# Patient Record
Sex: Female | Born: 2000
Health system: Southern US, Community
[De-identification: ages and names within clinical notes are randomized; demographics above are authoritative.]

## PROBLEM LIST (undated history)

## (undated) DIAGNOSIS — M6289 Other specified disorders of muscle: Secondary | ICD-10-CM

## (undated) DIAGNOSIS — R29898 Other symptoms and signs involving the musculoskeletal system: Secondary | ICD-10-CM

## (undated) DIAGNOSIS — Z931 Gastrostomy status: Secondary | ICD-10-CM

## (undated) DIAGNOSIS — I890 Lymphedema, not elsewhere classified: Secondary | ICD-10-CM

## (undated) DIAGNOSIS — M419 Scoliosis, unspecified: Secondary | ICD-10-CM

## (undated) DIAGNOSIS — Z930 Tracheostomy status: Secondary | ICD-10-CM

## (undated) HISTORY — PX: MUSCLE BIOPSY: SHX716

## (undated) HISTORY — PX: TYMPANOSTOMY TUBE PLACEMENT: SHX32

## (undated) HISTORY — PX: BACK SURGERY: SHX140

## (undated) HISTORY — PX: GASTROSTOMY TUBE PLACEMENT: SHX655

## (undated) HISTORY — PX: TRACHEOSTOMY: SUR1362

---
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
## 2020-01-03 DIAGNOSIS — R279 Unspecified lack of coordination: Secondary | ICD-10-CM | POA: Diagnosis not present

## 2020-01-03 DIAGNOSIS — Q688 Other specified congenital musculoskeletal deformities: Secondary | ICD-10-CM | POA: Diagnosis not present

## 2020-01-04 DIAGNOSIS — Q688 Other specified congenital musculoskeletal deformities: Secondary | ICD-10-CM | POA: Diagnosis not present

## 2020-01-05 DIAGNOSIS — Q688 Other specified congenital musculoskeletal deformities: Secondary | ICD-10-CM | POA: Diagnosis not present

## 2020-01-06 DIAGNOSIS — Q688 Other specified congenital musculoskeletal deformities: Secondary | ICD-10-CM | POA: Diagnosis not present

## 2020-01-07 DIAGNOSIS — Q688 Other specified congenital musculoskeletal deformities: Secondary | ICD-10-CM | POA: Diagnosis not present

## 2020-01-08 DIAGNOSIS — Q688 Other specified congenital musculoskeletal deformities: Secondary | ICD-10-CM | POA: Diagnosis not present

## 2020-01-10 DIAGNOSIS — R279 Unspecified lack of coordination: Secondary | ICD-10-CM | POA: Diagnosis not present

## 2020-01-10 DIAGNOSIS — Q688 Other specified congenital musculoskeletal deformities: Secondary | ICD-10-CM | POA: Diagnosis not present

## 2020-01-11 DIAGNOSIS — Q688 Other specified congenital musculoskeletal deformities: Secondary | ICD-10-CM | POA: Diagnosis not present

## 2020-01-12 ENCOUNTER — Other Ambulatory Visit (HOSPITAL_COMMUNITY): Payer: Self-pay | Admitting: Pediatric Pulmonology

## 2020-01-12 DIAGNOSIS — Q688 Other specified congenital musculoskeletal deformities: Secondary | ICD-10-CM | POA: Diagnosis not present

## 2020-01-13 DIAGNOSIS — Z79899 Other long term (current) drug therapy: Secondary | ICD-10-CM | POA: Diagnosis not present

## 2020-01-13 DIAGNOSIS — J984 Other disorders of lung: Secondary | ICD-10-CM | POA: Diagnosis not present

## 2020-01-13 DIAGNOSIS — F419 Anxiety disorder, unspecified: Secondary | ICD-10-CM | POA: Diagnosis not present

## 2020-01-13 DIAGNOSIS — M4145 Neuromuscular scoliosis, thoracolumbar region: Secondary | ICD-10-CM | POA: Diagnosis not present

## 2020-01-13 DIAGNOSIS — G7129 Other congenital myopathy: Secondary | ICD-10-CM | POA: Diagnosis not present

## 2020-01-13 DIAGNOSIS — J4540 Moderate persistent asthma, uncomplicated: Secondary | ICD-10-CM | POA: Diagnosis not present

## 2020-01-13 DIAGNOSIS — Q688 Other specified congenital musculoskeletal deformities: Secondary | ICD-10-CM | POA: Diagnosis not present

## 2020-01-13 DIAGNOSIS — M419 Scoliosis, unspecified: Secondary | ICD-10-CM | POA: Diagnosis not present

## 2020-01-13 DIAGNOSIS — Z9911 Dependence on respirator [ventilator] status: Secondary | ICD-10-CM | POA: Diagnosis not present

## 2020-01-13 DIAGNOSIS — R0689 Other abnormalities of breathing: Secondary | ICD-10-CM | POA: Diagnosis not present

## 2020-01-13 DIAGNOSIS — Z930 Tracheostomy status: Secondary | ICD-10-CM | POA: Diagnosis not present

## 2020-01-14 DIAGNOSIS — Q688 Other specified congenital musculoskeletal deformities: Secondary | ICD-10-CM | POA: Diagnosis not present

## 2020-01-16 DIAGNOSIS — Q688 Other specified congenital musculoskeletal deformities: Secondary | ICD-10-CM | POA: Diagnosis not present

## 2020-01-17 DIAGNOSIS — R279 Unspecified lack of coordination: Secondary | ICD-10-CM | POA: Diagnosis not present

## 2020-01-17 DIAGNOSIS — Q688 Other specified congenital musculoskeletal deformities: Secondary | ICD-10-CM | POA: Diagnosis not present

## 2020-01-18 DIAGNOSIS — Q688 Other specified congenital musculoskeletal deformities: Secondary | ICD-10-CM | POA: Diagnosis not present

## 2020-01-18 DIAGNOSIS — Z931 Gastrostomy status: Secondary | ICD-10-CM | POA: Diagnosis not present

## 2020-01-18 DIAGNOSIS — J9611 Chronic respiratory failure with hypoxia: Secondary | ICD-10-CM | POA: Diagnosis not present

## 2020-01-18 DIAGNOSIS — Z930 Tracheostomy status: Secondary | ICD-10-CM | POA: Diagnosis not present

## 2020-01-19 DIAGNOSIS — Q688 Other specified congenital musculoskeletal deformities: Secondary | ICD-10-CM | POA: Diagnosis not present

## 2020-01-20 DIAGNOSIS — Q688 Other specified congenital musculoskeletal deformities: Secondary | ICD-10-CM | POA: Diagnosis not present

## 2020-01-21 DIAGNOSIS — Z9911 Dependence on respirator [ventilator] status: Secondary | ICD-10-CM | POA: Diagnosis not present

## 2020-01-21 DIAGNOSIS — Q799 Congenital malformation of musculoskeletal system, unspecified: Secondary | ICD-10-CM | POA: Diagnosis not present

## 2020-01-21 DIAGNOSIS — M4140 Neuromuscular scoliosis, site unspecified: Secondary | ICD-10-CM | POA: Diagnosis not present

## 2020-01-21 DIAGNOSIS — J984 Other disorders of lung: Secondary | ICD-10-CM | POA: Diagnosis not present

## 2020-01-21 DIAGNOSIS — Q688 Other specified congenital musculoskeletal deformities: Secondary | ICD-10-CM | POA: Diagnosis not present

## 2020-01-22 DIAGNOSIS — Z931 Gastrostomy status: Secondary | ICD-10-CM | POA: Diagnosis not present

## 2020-01-22 DIAGNOSIS — J9611 Chronic respiratory failure with hypoxia: Secondary | ICD-10-CM | POA: Diagnosis not present

## 2020-01-22 DIAGNOSIS — Z930 Tracheostomy status: Secondary | ICD-10-CM | POA: Diagnosis not present

## 2020-01-23 DIAGNOSIS — Q688 Other specified congenital musculoskeletal deformities: Secondary | ICD-10-CM | POA: Diagnosis not present

## 2020-01-24 DIAGNOSIS — R279 Unspecified lack of coordination: Secondary | ICD-10-CM | POA: Diagnosis not present

## 2020-01-24 DIAGNOSIS — Q688 Other specified congenital musculoskeletal deformities: Secondary | ICD-10-CM | POA: Diagnosis not present

## 2020-01-24 DIAGNOSIS — Z930 Tracheostomy status: Secondary | ICD-10-CM | POA: Diagnosis not present

## 2020-01-24 DIAGNOSIS — Z931 Gastrostomy status: Secondary | ICD-10-CM | POA: Diagnosis not present

## 2020-01-24 DIAGNOSIS — J9611 Chronic respiratory failure with hypoxia: Secondary | ICD-10-CM | POA: Diagnosis not present

## 2020-01-25 DIAGNOSIS — Z931 Gastrostomy status: Secondary | ICD-10-CM | POA: Diagnosis not present

## 2020-01-25 DIAGNOSIS — Q688 Other specified congenital musculoskeletal deformities: Secondary | ICD-10-CM | POA: Diagnosis not present

## 2020-01-25 DIAGNOSIS — Z930 Tracheostomy status: Secondary | ICD-10-CM | POA: Diagnosis not present

## 2020-01-25 DIAGNOSIS — J9611 Chronic respiratory failure with hypoxia: Secondary | ICD-10-CM | POA: Diagnosis not present

## 2020-01-26 DIAGNOSIS — Q688 Other specified congenital musculoskeletal deformities: Secondary | ICD-10-CM | POA: Diagnosis not present

## 2020-01-27 DIAGNOSIS — Q688 Other specified congenital musculoskeletal deformities: Secondary | ICD-10-CM | POA: Diagnosis not present

## 2020-01-28 ENCOUNTER — Other Ambulatory Visit (HOSPITAL_COMMUNITY): Payer: Self-pay | Admitting: Pediatrics

## 2020-01-28 DIAGNOSIS — Z930 Tracheostomy status: Secondary | ICD-10-CM | POA: Diagnosis not present

## 2020-01-28 DIAGNOSIS — J9611 Chronic respiratory failure with hypoxia: Secondary | ICD-10-CM | POA: Diagnosis not present

## 2020-01-28 DIAGNOSIS — Z931 Gastrostomy status: Secondary | ICD-10-CM | POA: Diagnosis not present

## 2020-01-28 DIAGNOSIS — Q688 Other specified congenital musculoskeletal deformities: Secondary | ICD-10-CM | POA: Diagnosis not present

## 2020-01-29 DIAGNOSIS — Q688 Other specified congenital musculoskeletal deformities: Secondary | ICD-10-CM | POA: Diagnosis not present

## 2020-01-30 DIAGNOSIS — Q688 Other specified congenital musculoskeletal deformities: Secondary | ICD-10-CM | POA: Diagnosis not present

## 2020-01-31 DIAGNOSIS — R279 Unspecified lack of coordination: Secondary | ICD-10-CM | POA: Diagnosis not present

## 2020-01-31 DIAGNOSIS — Q688 Other specified congenital musculoskeletal deformities: Secondary | ICD-10-CM | POA: Diagnosis not present

## 2020-02-01 DIAGNOSIS — Q688 Other specified congenital musculoskeletal deformities: Secondary | ICD-10-CM | POA: Diagnosis not present

## 2020-02-02 DIAGNOSIS — Q688 Other specified congenital musculoskeletal deformities: Secondary | ICD-10-CM | POA: Diagnosis not present

## 2020-02-03 DIAGNOSIS — Q688 Other specified congenital musculoskeletal deformities: Secondary | ICD-10-CM | POA: Diagnosis not present

## 2020-02-04 DIAGNOSIS — Q688 Other specified congenital musculoskeletal deformities: Secondary | ICD-10-CM | POA: Diagnosis not present

## 2020-02-08 DIAGNOSIS — Q688 Other specified congenital musculoskeletal deformities: Secondary | ICD-10-CM | POA: Diagnosis not present

## 2020-02-09 DIAGNOSIS — Q688 Other specified congenital musculoskeletal deformities: Secondary | ICD-10-CM | POA: Diagnosis not present

## 2020-02-10 DIAGNOSIS — Q688 Other specified congenital musculoskeletal deformities: Secondary | ICD-10-CM | POA: Diagnosis not present

## 2020-02-11 DIAGNOSIS — Q688 Other specified congenital musculoskeletal deformities: Secondary | ICD-10-CM | POA: Diagnosis not present

## 2020-02-12 DIAGNOSIS — Q688 Other specified congenital musculoskeletal deformities: Secondary | ICD-10-CM | POA: Diagnosis not present

## 2020-02-13 DIAGNOSIS — Q688 Other specified congenital musculoskeletal deformities: Secondary | ICD-10-CM | POA: Diagnosis not present

## 2020-02-14 DIAGNOSIS — Q688 Other specified congenital musculoskeletal deformities: Secondary | ICD-10-CM | POA: Diagnosis not present

## 2020-02-15 DIAGNOSIS — Q688 Other specified congenital musculoskeletal deformities: Secondary | ICD-10-CM | POA: Diagnosis not present

## 2020-02-16 DIAGNOSIS — Q688 Other specified congenital musculoskeletal deformities: Secondary | ICD-10-CM | POA: Diagnosis not present

## 2020-02-16 DIAGNOSIS — R279 Unspecified lack of coordination: Secondary | ICD-10-CM | POA: Diagnosis not present

## 2020-02-17 DIAGNOSIS — Q688 Other specified congenital musculoskeletal deformities: Secondary | ICD-10-CM | POA: Diagnosis not present

## 2020-02-17 DIAGNOSIS — Z931 Gastrostomy status: Secondary | ICD-10-CM | POA: Diagnosis not present

## 2020-02-17 DIAGNOSIS — J9611 Chronic respiratory failure with hypoxia: Secondary | ICD-10-CM | POA: Diagnosis not present

## 2020-02-17 DIAGNOSIS — Z930 Tracheostomy status: Secondary | ICD-10-CM | POA: Diagnosis not present

## 2020-02-18 DIAGNOSIS — Q688 Other specified congenital musculoskeletal deformities: Secondary | ICD-10-CM | POA: Diagnosis not present

## 2020-02-19 DIAGNOSIS — Q688 Other specified congenital musculoskeletal deformities: Secondary | ICD-10-CM | POA: Diagnosis not present

## 2020-02-21 DIAGNOSIS — Z930 Tracheostomy status: Secondary | ICD-10-CM | POA: Diagnosis not present

## 2020-02-21 DIAGNOSIS — Z931 Gastrostomy status: Secondary | ICD-10-CM | POA: Diagnosis not present

## 2020-02-21 DIAGNOSIS — J9611 Chronic respiratory failure with hypoxia: Secondary | ICD-10-CM | POA: Diagnosis not present

## 2020-02-21 DIAGNOSIS — Q688 Other specified congenital musculoskeletal deformities: Secondary | ICD-10-CM | POA: Diagnosis not present

## 2020-02-22 DIAGNOSIS — Q688 Other specified congenital musculoskeletal deformities: Secondary | ICD-10-CM | POA: Diagnosis not present

## 2020-02-23 DIAGNOSIS — J9611 Chronic respiratory failure with hypoxia: Secondary | ICD-10-CM | POA: Diagnosis not present

## 2020-02-23 DIAGNOSIS — Z930 Tracheostomy status: Secondary | ICD-10-CM | POA: Diagnosis not present

## 2020-02-23 DIAGNOSIS — Z931 Gastrostomy status: Secondary | ICD-10-CM | POA: Diagnosis not present

## 2020-02-23 DIAGNOSIS — Q799 Congenital malformation of musculoskeletal system, unspecified: Secondary | ICD-10-CM | POA: Diagnosis not present

## 2020-02-23 DIAGNOSIS — Z9911 Dependence on respirator [ventilator] status: Secondary | ICD-10-CM | POA: Diagnosis not present

## 2020-02-23 DIAGNOSIS — R279 Unspecified lack of coordination: Secondary | ICD-10-CM | POA: Diagnosis not present

## 2020-02-23 DIAGNOSIS — Q688 Other specified congenital musculoskeletal deformities: Secondary | ICD-10-CM | POA: Diagnosis not present

## 2020-02-23 DIAGNOSIS — M4140 Neuromuscular scoliosis, site unspecified: Secondary | ICD-10-CM | POA: Diagnosis not present

## 2020-02-24 DIAGNOSIS — Q688 Other specified congenital musculoskeletal deformities: Secondary | ICD-10-CM | POA: Diagnosis not present

## 2020-02-25 DIAGNOSIS — Q688 Other specified congenital musculoskeletal deformities: Secondary | ICD-10-CM | POA: Diagnosis not present

## 2020-02-26 DIAGNOSIS — Q688 Other specified congenital musculoskeletal deformities: Secondary | ICD-10-CM | POA: Diagnosis not present

## 2020-02-28 DIAGNOSIS — Z931 Gastrostomy status: Secondary | ICD-10-CM | POA: Diagnosis not present

## 2020-02-28 DIAGNOSIS — J9611 Chronic respiratory failure with hypoxia: Secondary | ICD-10-CM | POA: Diagnosis not present

## 2020-02-28 DIAGNOSIS — R279 Unspecified lack of coordination: Secondary | ICD-10-CM | POA: Diagnosis not present

## 2020-02-28 DIAGNOSIS — Q688 Other specified congenital musculoskeletal deformities: Secondary | ICD-10-CM | POA: Diagnosis not present

## 2020-02-28 DIAGNOSIS — Z930 Tracheostomy status: Secondary | ICD-10-CM | POA: Diagnosis not present

## 2020-02-28 DIAGNOSIS — R2689 Other abnormalities of gait and mobility: Secondary | ICD-10-CM | POA: Diagnosis not present

## 2020-02-29 DIAGNOSIS — Q688 Other specified congenital musculoskeletal deformities: Secondary | ICD-10-CM | POA: Diagnosis not present

## 2020-03-01 DIAGNOSIS — Q688 Other specified congenital musculoskeletal deformities: Secondary | ICD-10-CM | POA: Diagnosis not present

## 2020-03-02 DIAGNOSIS — Q688 Other specified congenital musculoskeletal deformities: Secondary | ICD-10-CM | POA: Diagnosis not present

## 2020-03-03 ENCOUNTER — Ambulatory Visit: Payer: 59 | Attending: Internal Medicine

## 2020-03-03 DIAGNOSIS — Z23 Encounter for immunization: Secondary | ICD-10-CM

## 2020-03-03 DIAGNOSIS — Q688 Other specified congenital musculoskeletal deformities: Secondary | ICD-10-CM | POA: Diagnosis not present

## 2020-03-03 NOTE — Progress Notes (Signed)
° °  Covid-19 Vaccination Clinic  Name:  Debra Hill    MRN: [id number] DOB: August 18, 2000  03/03/2020  Debra Hill was observed post Covid-19 immunization for 15 minutes without incident. She was provided with Vaccine Information Sheet and instruction to access the V-Safe system.   Debra Hill was instructed to call 911 with any severe reactions post vaccine:  Difficulty breathing   Swelling of face and throat   A fast heartbeat   A bad rash all over body   Dizziness and weakness   Immunizations Administered    Name Date Dose VIS Date Route   Pfizer COVID-19 Vaccine 03/03/2020 10:15 AM 0.3 mL 02/16/2020 Intramuscular   Manufacturer: [employer], [employer]   Lot: [id number]   NDC: [address]-9

## 2020-03-04 DIAGNOSIS — Q688 Other specified congenital musculoskeletal deformities: Secondary | ICD-10-CM | POA: Diagnosis not present

## 2020-03-06 DIAGNOSIS — R279 Unspecified lack of coordination: Secondary | ICD-10-CM | POA: Diagnosis not present

## 2020-03-06 DIAGNOSIS — Q688 Other specified congenital musculoskeletal deformities: Secondary | ICD-10-CM | POA: Diagnosis not present

## 2020-03-07 DIAGNOSIS — Q688 Other specified congenital musculoskeletal deformities: Secondary | ICD-10-CM | POA: Diagnosis not present

## 2020-03-08 DIAGNOSIS — Q688 Other specified congenital musculoskeletal deformities: Secondary | ICD-10-CM | POA: Diagnosis not present

## 2020-03-09 DIAGNOSIS — Q688 Other specified congenital musculoskeletal deformities: Secondary | ICD-10-CM | POA: Diagnosis not present

## 2020-03-10 DIAGNOSIS — Q688 Other specified congenital musculoskeletal deformities: Secondary | ICD-10-CM | POA: Diagnosis not present

## 2020-03-11 DIAGNOSIS — Q688 Other specified congenital musculoskeletal deformities: Secondary | ICD-10-CM | POA: Diagnosis not present

## 2020-03-12 DIAGNOSIS — Q688 Other specified congenital musculoskeletal deformities: Secondary | ICD-10-CM | POA: Diagnosis not present

## 2020-03-13 DIAGNOSIS — Q688 Other specified congenital musculoskeletal deformities: Secondary | ICD-10-CM | POA: Diagnosis not present

## 2020-03-13 DIAGNOSIS — R279 Unspecified lack of coordination: Secondary | ICD-10-CM | POA: Diagnosis not present

## 2020-03-14 DIAGNOSIS — J9611 Chronic respiratory failure with hypoxia: Secondary | ICD-10-CM | POA: Diagnosis not present

## 2020-03-14 DIAGNOSIS — Z930 Tracheostomy status: Secondary | ICD-10-CM | POA: Diagnosis not present

## 2020-03-14 DIAGNOSIS — Q688 Other specified congenital musculoskeletal deformities: Secondary | ICD-10-CM | POA: Diagnosis not present

## 2020-03-14 DIAGNOSIS — Z931 Gastrostomy status: Secondary | ICD-10-CM | POA: Diagnosis not present

## 2020-03-15 DIAGNOSIS — Q688 Other specified congenital musculoskeletal deformities: Secondary | ICD-10-CM | POA: Diagnosis not present

## 2020-03-16 DIAGNOSIS — Q688 Other specified congenital musculoskeletal deformities: Secondary | ICD-10-CM | POA: Diagnosis not present

## 2020-03-17 ENCOUNTER — Other Ambulatory Visit (HOSPITAL_COMMUNITY): Payer: Self-pay | Admitting: Pediatrics

## 2020-03-17 DIAGNOSIS — Q688 Other specified congenital musculoskeletal deformities: Secondary | ICD-10-CM | POA: Diagnosis not present

## 2020-03-18 DIAGNOSIS — Q688 Other specified congenital musculoskeletal deformities: Secondary | ICD-10-CM | POA: Diagnosis not present

## 2020-03-19 DIAGNOSIS — J9611 Chronic respiratory failure with hypoxia: Secondary | ICD-10-CM | POA: Diagnosis not present

## 2020-03-19 DIAGNOSIS — Z930 Tracheostomy status: Secondary | ICD-10-CM | POA: Diagnosis not present

## 2020-03-19 DIAGNOSIS — Z931 Gastrostomy status: Secondary | ICD-10-CM | POA: Diagnosis not present

## 2020-03-20 DIAGNOSIS — R279 Unspecified lack of coordination: Secondary | ICD-10-CM | POA: Diagnosis not present

## 2020-03-20 DIAGNOSIS — Q688 Other specified congenital musculoskeletal deformities: Secondary | ICD-10-CM | POA: Diagnosis not present

## 2020-03-21 DIAGNOSIS — Q688 Other specified congenital musculoskeletal deformities: Secondary | ICD-10-CM | POA: Diagnosis not present

## 2020-03-23 DIAGNOSIS — J9611 Chronic respiratory failure with hypoxia: Secondary | ICD-10-CM | POA: Diagnosis not present

## 2020-03-23 DIAGNOSIS — Z930 Tracheostomy status: Secondary | ICD-10-CM | POA: Diagnosis not present

## 2020-03-23 DIAGNOSIS — Z931 Gastrostomy status: Secondary | ICD-10-CM | POA: Diagnosis not present

## 2020-03-24 DIAGNOSIS — Q688 Other specified congenital musculoskeletal deformities: Secondary | ICD-10-CM | POA: Diagnosis not present

## 2020-03-27 DIAGNOSIS — Q688 Other specified congenital musculoskeletal deformities: Secondary | ICD-10-CM | POA: Diagnosis not present

## 2020-03-27 DIAGNOSIS — R279 Unspecified lack of coordination: Secondary | ICD-10-CM | POA: Diagnosis not present

## 2020-03-28 DIAGNOSIS — Q688 Other specified congenital musculoskeletal deformities: Secondary | ICD-10-CM | POA: Diagnosis not present

## 2020-03-29 DIAGNOSIS — Q688 Other specified congenital musculoskeletal deformities: Secondary | ICD-10-CM | POA: Diagnosis not present

## 2020-03-30 DIAGNOSIS — Q688 Other specified congenital musculoskeletal deformities: Secondary | ICD-10-CM | POA: Diagnosis not present

## 2020-03-31 DIAGNOSIS — Q688 Other specified congenital musculoskeletal deformities: Secondary | ICD-10-CM | POA: Diagnosis not present

## 2020-04-03 DIAGNOSIS — Q688 Other specified congenital musculoskeletal deformities: Secondary | ICD-10-CM | POA: Diagnosis not present

## 2020-04-04 DIAGNOSIS — Q688 Other specified congenital musculoskeletal deformities: Secondary | ICD-10-CM | POA: Diagnosis not present

## 2020-04-05 DIAGNOSIS — Q688 Other specified congenital musculoskeletal deformities: Secondary | ICD-10-CM | POA: Diagnosis not present

## 2020-04-06 DIAGNOSIS — Q688 Other specified congenital musculoskeletal deformities: Secondary | ICD-10-CM | POA: Diagnosis not present

## 2020-04-07 DIAGNOSIS — Q688 Other specified congenital musculoskeletal deformities: Secondary | ICD-10-CM | POA: Diagnosis not present

## 2020-04-07 DIAGNOSIS — G712 Congenital myopathy, unspecified: Secondary | ICD-10-CM | POA: Diagnosis not present

## 2020-04-07 DIAGNOSIS — Z930 Tracheostomy status: Secondary | ICD-10-CM | POA: Diagnosis not present

## 2020-04-07 DIAGNOSIS — Z1589 Genetic susceptibility to other disease: Secondary | ICD-10-CM | POA: Diagnosis not present

## 2020-04-07 DIAGNOSIS — Z9911 Dependence on respirator [ventilator] status: Secondary | ICD-10-CM | POA: Diagnosis not present

## 2020-04-07 DIAGNOSIS — J4540 Moderate persistent asthma, uncomplicated: Secondary | ICD-10-CM | POA: Diagnosis not present

## 2020-04-07 DIAGNOSIS — J984 Other disorders of lung: Secondary | ICD-10-CM | POA: Diagnosis not present

## 2020-04-07 DIAGNOSIS — M6281 Muscle weakness (generalized): Secondary | ICD-10-CM | POA: Diagnosis not present

## 2020-04-07 DIAGNOSIS — G7129 Other congenital myopathy: Secondary | ICD-10-CM | POA: Diagnosis not present

## 2020-04-09 DIAGNOSIS — Q688 Other specified congenital musculoskeletal deformities: Secondary | ICD-10-CM | POA: Diagnosis not present

## 2020-04-10 DIAGNOSIS — Q688 Other specified congenital musculoskeletal deformities: Secondary | ICD-10-CM | POA: Diagnosis not present

## 2020-04-11 DIAGNOSIS — Z930 Tracheostomy status: Secondary | ICD-10-CM | POA: Diagnosis not present

## 2020-04-11 DIAGNOSIS — Q688 Other specified congenital musculoskeletal deformities: Secondary | ICD-10-CM | POA: Diagnosis not present

## 2020-04-11 DIAGNOSIS — Z931 Gastrostomy status: Secondary | ICD-10-CM | POA: Diagnosis not present

## 2020-04-11 DIAGNOSIS — J9611 Chronic respiratory failure with hypoxia: Secondary | ICD-10-CM | POA: Diagnosis not present

## 2020-04-12 DIAGNOSIS — Q688 Other specified congenital musculoskeletal deformities: Secondary | ICD-10-CM | POA: Diagnosis not present

## 2020-04-13 DIAGNOSIS — Q688 Other specified congenital musculoskeletal deformities: Secondary | ICD-10-CM | POA: Diagnosis not present

## 2020-04-14 DIAGNOSIS — Q688 Other specified congenital musculoskeletal deformities: Secondary | ICD-10-CM | POA: Diagnosis not present

## 2020-04-16 DIAGNOSIS — Q688 Other specified congenital musculoskeletal deformities: Secondary | ICD-10-CM | POA: Diagnosis not present

## 2020-04-17 DIAGNOSIS — Q688 Other specified congenital musculoskeletal deformities: Secondary | ICD-10-CM | POA: Diagnosis not present

## 2020-04-17 DIAGNOSIS — R279 Unspecified lack of coordination: Secondary | ICD-10-CM | POA: Diagnosis not present

## 2020-04-18 DIAGNOSIS — J9611 Chronic respiratory failure with hypoxia: Secondary | ICD-10-CM | POA: Diagnosis not present

## 2020-04-18 DIAGNOSIS — Z930 Tracheostomy status: Secondary | ICD-10-CM | POA: Diagnosis not present

## 2020-04-18 DIAGNOSIS — Q688 Other specified congenital musculoskeletal deformities: Secondary | ICD-10-CM | POA: Diagnosis not present

## 2020-04-18 DIAGNOSIS — Z931 Gastrostomy status: Secondary | ICD-10-CM | POA: Diagnosis not present

## 2020-04-19 DIAGNOSIS — Q688 Other specified congenital musculoskeletal deformities: Secondary | ICD-10-CM | POA: Diagnosis not present

## 2020-04-20 DIAGNOSIS — Q688 Other specified congenital musculoskeletal deformities: Secondary | ICD-10-CM | POA: Diagnosis not present

## 2020-04-22 DIAGNOSIS — Z931 Gastrostomy status: Secondary | ICD-10-CM | POA: Diagnosis not present

## 2020-04-22 DIAGNOSIS — Z930 Tracheostomy status: Secondary | ICD-10-CM | POA: Diagnosis not present

## 2020-04-22 DIAGNOSIS — J9611 Chronic respiratory failure with hypoxia: Secondary | ICD-10-CM | POA: Diagnosis not present

## 2020-04-23 DIAGNOSIS — Q688 Other specified congenital musculoskeletal deformities: Secondary | ICD-10-CM | POA: Diagnosis not present

## 2020-04-24 DIAGNOSIS — Z931 Gastrostomy status: Secondary | ICD-10-CM | POA: Diagnosis not present

## 2020-04-24 DIAGNOSIS — Q688 Other specified congenital musculoskeletal deformities: Secondary | ICD-10-CM | POA: Diagnosis not present

## 2020-04-24 DIAGNOSIS — Z930 Tracheostomy status: Secondary | ICD-10-CM | POA: Diagnosis not present

## 2020-04-24 DIAGNOSIS — R279 Unspecified lack of coordination: Secondary | ICD-10-CM | POA: Diagnosis not present

## 2020-04-24 DIAGNOSIS — J9611 Chronic respiratory failure with hypoxia: Secondary | ICD-10-CM | POA: Diagnosis not present

## 2020-04-25 DIAGNOSIS — Q688 Other specified congenital musculoskeletal deformities: Secondary | ICD-10-CM | POA: Diagnosis not present

## 2020-04-26 DIAGNOSIS — Q688 Other specified congenital musculoskeletal deformities: Secondary | ICD-10-CM | POA: Diagnosis not present

## 2020-04-27 DIAGNOSIS — Q688 Other specified congenital musculoskeletal deformities: Secondary | ICD-10-CM | POA: Diagnosis not present

## 2020-04-28 DIAGNOSIS — Q688 Other specified congenital musculoskeletal deformities: Secondary | ICD-10-CM | POA: Diagnosis not present

## 2020-04-29 DIAGNOSIS — Q688 Other specified congenital musculoskeletal deformities: Secondary | ICD-10-CM | POA: Diagnosis not present

## 2020-04-30 DIAGNOSIS — Q688 Other specified congenital musculoskeletal deformities: Secondary | ICD-10-CM | POA: Diagnosis not present

## 2020-05-01 DIAGNOSIS — Q688 Other specified congenital musculoskeletal deformities: Secondary | ICD-10-CM | POA: Diagnosis not present

## 2020-05-01 DIAGNOSIS — R279 Unspecified lack of coordination: Secondary | ICD-10-CM | POA: Diagnosis not present

## 2020-05-02 DIAGNOSIS — Q688 Other specified congenital musculoskeletal deformities: Secondary | ICD-10-CM | POA: Diagnosis not present

## 2020-05-03 ENCOUNTER — Other Ambulatory Visit (HOSPITAL_COMMUNITY): Payer: Self-pay | Admitting: Pediatrics

## 2020-05-03 DIAGNOSIS — Q688 Other specified congenital musculoskeletal deformities: Secondary | ICD-10-CM | POA: Diagnosis not present

## 2020-05-04 DIAGNOSIS — Q688 Other specified congenital musculoskeletal deformities: Secondary | ICD-10-CM | POA: Diagnosis not present

## 2020-05-05 DIAGNOSIS — Q688 Other specified congenital musculoskeletal deformities: Secondary | ICD-10-CM | POA: Diagnosis not present

## 2020-05-07 DIAGNOSIS — Q688 Other specified congenital musculoskeletal deformities: Secondary | ICD-10-CM | POA: Diagnosis not present

## 2020-05-08 DIAGNOSIS — Q688 Other specified congenital musculoskeletal deformities: Secondary | ICD-10-CM | POA: Diagnosis not present

## 2020-05-08 DIAGNOSIS — R279 Unspecified lack of coordination: Secondary | ICD-10-CM | POA: Diagnosis not present

## 2020-05-09 DIAGNOSIS — Q688 Other specified congenital musculoskeletal deformities: Secondary | ICD-10-CM | POA: Diagnosis not present

## 2020-05-10 DIAGNOSIS — Q688 Other specified congenital musculoskeletal deformities: Secondary | ICD-10-CM | POA: Diagnosis not present

## 2020-05-11 DIAGNOSIS — Q688 Other specified congenital musculoskeletal deformities: Secondary | ICD-10-CM | POA: Diagnosis not present

## 2020-05-12 DIAGNOSIS — Q688 Other specified congenital musculoskeletal deformities: Secondary | ICD-10-CM | POA: Diagnosis not present

## 2020-05-15 DIAGNOSIS — Z931 Gastrostomy status: Secondary | ICD-10-CM | POA: Diagnosis not present

## 2020-05-15 DIAGNOSIS — J9611 Chronic respiratory failure with hypoxia: Secondary | ICD-10-CM | POA: Diagnosis not present

## 2020-05-15 DIAGNOSIS — Q688 Other specified congenital musculoskeletal deformities: Secondary | ICD-10-CM | POA: Diagnosis not present

## 2020-05-15 DIAGNOSIS — Z930 Tracheostomy status: Secondary | ICD-10-CM | POA: Diagnosis not present

## 2020-05-16 DIAGNOSIS — Q688 Other specified congenital musculoskeletal deformities: Secondary | ICD-10-CM | POA: Diagnosis not present

## 2020-05-17 DIAGNOSIS — Q688 Other specified congenital musculoskeletal deformities: Secondary | ICD-10-CM | POA: Diagnosis not present

## 2020-05-17 DIAGNOSIS — R279 Unspecified lack of coordination: Secondary | ICD-10-CM | POA: Diagnosis not present

## 2020-05-18 DIAGNOSIS — Q688 Other specified congenital musculoskeletal deformities: Secondary | ICD-10-CM | POA: Diagnosis not present

## 2020-05-19 ENCOUNTER — Other Ambulatory Visit (HOSPITAL_COMMUNITY): Payer: Self-pay | Admitting: Pediatrics

## 2020-05-19 DIAGNOSIS — Q688 Other specified congenital musculoskeletal deformities: Secondary | ICD-10-CM | POA: Diagnosis not present

## 2020-05-19 DIAGNOSIS — J9611 Chronic respiratory failure with hypoxia: Secondary | ICD-10-CM | POA: Diagnosis not present

## 2020-05-19 DIAGNOSIS — Z931 Gastrostomy status: Secondary | ICD-10-CM | POA: Diagnosis not present

## 2020-05-19 DIAGNOSIS — Z930 Tracheostomy status: Secondary | ICD-10-CM | POA: Diagnosis not present

## 2020-05-20 DIAGNOSIS — Q688 Other specified congenital musculoskeletal deformities: Secondary | ICD-10-CM | POA: Diagnosis not present

## 2020-05-21 DIAGNOSIS — Q688 Other specified congenital musculoskeletal deformities: Secondary | ICD-10-CM | POA: Diagnosis not present

## 2020-05-22 DIAGNOSIS — R279 Unspecified lack of coordination: Secondary | ICD-10-CM | POA: Diagnosis not present

## 2020-05-22 DIAGNOSIS — Q688 Other specified congenital musculoskeletal deformities: Secondary | ICD-10-CM | POA: Diagnosis not present

## 2020-05-23 DIAGNOSIS — Q688 Other specified congenital musculoskeletal deformities: Secondary | ICD-10-CM | POA: Diagnosis not present

## 2020-05-23 DIAGNOSIS — Z930 Tracheostomy status: Secondary | ICD-10-CM | POA: Diagnosis not present

## 2020-05-23 DIAGNOSIS — Z931 Gastrostomy status: Secondary | ICD-10-CM | POA: Diagnosis not present

## 2020-05-23 DIAGNOSIS — J9611 Chronic respiratory failure with hypoxia: Secondary | ICD-10-CM | POA: Diagnosis not present

## 2020-05-24 DIAGNOSIS — Q688 Other specified congenital musculoskeletal deformities: Secondary | ICD-10-CM | POA: Diagnosis not present

## 2020-05-25 DIAGNOSIS — Q688 Other specified congenital musculoskeletal deformities: Secondary | ICD-10-CM | POA: Diagnosis not present

## 2020-05-26 DIAGNOSIS — Z930 Tracheostomy status: Secondary | ICD-10-CM | POA: Diagnosis not present

## 2020-05-26 DIAGNOSIS — Q688 Other specified congenital musculoskeletal deformities: Secondary | ICD-10-CM | POA: Diagnosis not present

## 2020-05-26 DIAGNOSIS — Z931 Gastrostomy status: Secondary | ICD-10-CM | POA: Diagnosis not present

## 2020-05-26 DIAGNOSIS — J9611 Chronic respiratory failure with hypoxia: Secondary | ICD-10-CM | POA: Diagnosis not present

## 2020-05-27 DIAGNOSIS — Q688 Other specified congenital musculoskeletal deformities: Secondary | ICD-10-CM | POA: Diagnosis not present

## 2020-05-28 DIAGNOSIS — Q688 Other specified congenital musculoskeletal deformities: Secondary | ICD-10-CM | POA: Diagnosis not present

## 2020-05-29 ENCOUNTER — Other Ambulatory Visit (HOSPITAL_COMMUNITY): Payer: Self-pay | Admitting: Pediatrics

## 2020-05-29 DIAGNOSIS — R279 Unspecified lack of coordination: Secondary | ICD-10-CM | POA: Diagnosis not present

## 2020-05-29 DIAGNOSIS — Q688 Other specified congenital musculoskeletal deformities: Secondary | ICD-10-CM | POA: Diagnosis not present

## 2020-05-30 DIAGNOSIS — Z931 Gastrostomy status: Secondary | ICD-10-CM | POA: Diagnosis not present

## 2020-05-30 DIAGNOSIS — J9611 Chronic respiratory failure with hypoxia: Secondary | ICD-10-CM | POA: Diagnosis not present

## 2020-05-30 DIAGNOSIS — Z930 Tracheostomy status: Secondary | ICD-10-CM | POA: Diagnosis not present

## 2020-05-30 DIAGNOSIS — Q688 Other specified congenital musculoskeletal deformities: Secondary | ICD-10-CM | POA: Diagnosis not present

## 2020-05-31 DIAGNOSIS — Q688 Other specified congenital musculoskeletal deformities: Secondary | ICD-10-CM | POA: Diagnosis not present

## 2020-06-01 DIAGNOSIS — Q688 Other specified congenital musculoskeletal deformities: Secondary | ICD-10-CM | POA: Diagnosis not present

## 2020-06-02 DIAGNOSIS — Q688 Other specified congenital musculoskeletal deformities: Secondary | ICD-10-CM | POA: Diagnosis not present

## 2020-06-03 DIAGNOSIS — Q688 Other specified congenital musculoskeletal deformities: Secondary | ICD-10-CM | POA: Diagnosis not present

## 2020-06-04 DIAGNOSIS — Q688 Other specified congenital musculoskeletal deformities: Secondary | ICD-10-CM | POA: Diagnosis not present

## 2020-06-05 DIAGNOSIS — Q688 Other specified congenital musculoskeletal deformities: Secondary | ICD-10-CM | POA: Diagnosis not present

## 2020-06-05 DIAGNOSIS — R279 Unspecified lack of coordination: Secondary | ICD-10-CM | POA: Diagnosis not present

## 2020-06-06 DIAGNOSIS — Q688 Other specified congenital musculoskeletal deformities: Secondary | ICD-10-CM | POA: Diagnosis not present

## 2020-06-07 DIAGNOSIS — Q688 Other specified congenital musculoskeletal deformities: Secondary | ICD-10-CM | POA: Diagnosis not present

## 2020-06-08 DIAGNOSIS — Q688 Other specified congenital musculoskeletal deformities: Secondary | ICD-10-CM | POA: Diagnosis not present

## 2020-06-09 DIAGNOSIS — Q688 Other specified congenital musculoskeletal deformities: Secondary | ICD-10-CM | POA: Diagnosis not present

## 2020-06-10 DIAGNOSIS — Q688 Other specified congenital musculoskeletal deformities: Secondary | ICD-10-CM | POA: Diagnosis not present

## 2020-06-11 DIAGNOSIS — Q688 Other specified congenital musculoskeletal deformities: Secondary | ICD-10-CM | POA: Diagnosis not present

## 2020-06-12 DIAGNOSIS — R279 Unspecified lack of coordination: Secondary | ICD-10-CM | POA: Diagnosis not present

## 2020-06-12 DIAGNOSIS — Q688 Other specified congenital musculoskeletal deformities: Secondary | ICD-10-CM | POA: Diagnosis not present

## 2020-06-13 DIAGNOSIS — Q688 Other specified congenital musculoskeletal deformities: Secondary | ICD-10-CM | POA: Diagnosis not present

## 2020-06-14 DIAGNOSIS — Q688 Other specified congenital musculoskeletal deformities: Secondary | ICD-10-CM | POA: Diagnosis not present

## 2020-06-15 DIAGNOSIS — Q688 Other specified congenital musculoskeletal deformities: Secondary | ICD-10-CM | POA: Diagnosis not present

## 2020-06-16 DIAGNOSIS — Q688 Other specified congenital musculoskeletal deformities: Secondary | ICD-10-CM | POA: Diagnosis not present

## 2020-06-17 DIAGNOSIS — Q688 Other specified congenital musculoskeletal deformities: Secondary | ICD-10-CM | POA: Diagnosis not present

## 2020-06-18 DIAGNOSIS — Q688 Other specified congenital musculoskeletal deformities: Secondary | ICD-10-CM | POA: Diagnosis not present

## 2020-06-19 DIAGNOSIS — Z931 Gastrostomy status: Secondary | ICD-10-CM | POA: Diagnosis not present

## 2020-06-19 DIAGNOSIS — J9611 Chronic respiratory failure with hypoxia: Secondary | ICD-10-CM | POA: Diagnosis not present

## 2020-06-19 DIAGNOSIS — R279 Unspecified lack of coordination: Secondary | ICD-10-CM | POA: Diagnosis not present

## 2020-06-19 DIAGNOSIS — Z930 Tracheostomy status: Secondary | ICD-10-CM | POA: Diagnosis not present

## 2020-06-19 DIAGNOSIS — Q688 Other specified congenital musculoskeletal deformities: Secondary | ICD-10-CM | POA: Diagnosis not present

## 2020-06-20 DIAGNOSIS — Q688 Other specified congenital musculoskeletal deformities: Secondary | ICD-10-CM | POA: Diagnosis not present

## 2020-06-21 ENCOUNTER — Other Ambulatory Visit (HOSPITAL_COMMUNITY): Payer: Self-pay | Admitting: Pediatrics

## 2020-06-21 DIAGNOSIS — Q688 Other specified congenital musculoskeletal deformities: Secondary | ICD-10-CM | POA: Diagnosis not present

## 2020-06-22 DIAGNOSIS — Q688 Other specified congenital musculoskeletal deformities: Secondary | ICD-10-CM | POA: Diagnosis not present

## 2020-06-23 DIAGNOSIS — Q688 Other specified congenital musculoskeletal deformities: Secondary | ICD-10-CM | POA: Diagnosis not present

## 2020-06-23 DIAGNOSIS — Z931 Gastrostomy status: Secondary | ICD-10-CM | POA: Diagnosis not present

## 2020-06-23 DIAGNOSIS — J9611 Chronic respiratory failure with hypoxia: Secondary | ICD-10-CM | POA: Diagnosis not present

## 2020-06-23 DIAGNOSIS — Z930 Tracheostomy status: Secondary | ICD-10-CM | POA: Diagnosis not present

## 2020-06-24 DIAGNOSIS — Q688 Other specified congenital musculoskeletal deformities: Secondary | ICD-10-CM | POA: Diagnosis not present

## 2020-06-25 DIAGNOSIS — Q688 Other specified congenital musculoskeletal deformities: Secondary | ICD-10-CM | POA: Diagnosis not present

## 2020-06-26 DIAGNOSIS — R279 Unspecified lack of coordination: Secondary | ICD-10-CM | POA: Diagnosis not present

## 2020-06-26 DIAGNOSIS — Q688 Other specified congenital musculoskeletal deformities: Secondary | ICD-10-CM | POA: Diagnosis not present

## 2020-06-27 DIAGNOSIS — J9611 Chronic respiratory failure with hypoxia: Secondary | ICD-10-CM | POA: Diagnosis not present

## 2020-06-27 DIAGNOSIS — Z931 Gastrostomy status: Secondary | ICD-10-CM | POA: Diagnosis not present

## 2020-06-27 DIAGNOSIS — Z930 Tracheostomy status: Secondary | ICD-10-CM | POA: Diagnosis not present

## 2020-06-27 DIAGNOSIS — Q688 Other specified congenital musculoskeletal deformities: Secondary | ICD-10-CM | POA: Diagnosis not present

## 2020-06-28 DIAGNOSIS — Q688 Other specified congenital musculoskeletal deformities: Secondary | ICD-10-CM | POA: Diagnosis not present

## 2020-06-29 DIAGNOSIS — Q688 Other specified congenital musculoskeletal deformities: Secondary | ICD-10-CM | POA: Diagnosis not present

## 2020-06-30 DIAGNOSIS — Q688 Other specified congenital musculoskeletal deformities: Secondary | ICD-10-CM | POA: Diagnosis not present

## 2020-07-01 DIAGNOSIS — Q688 Other specified congenital musculoskeletal deformities: Secondary | ICD-10-CM | POA: Diagnosis not present

## 2020-07-02 DIAGNOSIS — Q688 Other specified congenital musculoskeletal deformities: Secondary | ICD-10-CM | POA: Diagnosis not present

## 2020-07-03 DIAGNOSIS — R279 Unspecified lack of coordination: Secondary | ICD-10-CM | POA: Diagnosis not present

## 2020-07-03 DIAGNOSIS — Q688 Other specified congenital musculoskeletal deformities: Secondary | ICD-10-CM | POA: Diagnosis not present

## 2020-07-04 DIAGNOSIS — Q688 Other specified congenital musculoskeletal deformities: Secondary | ICD-10-CM | POA: Diagnosis not present

## 2020-07-05 DIAGNOSIS — Q688 Other specified congenital musculoskeletal deformities: Secondary | ICD-10-CM | POA: Diagnosis not present

## 2020-07-06 DIAGNOSIS — Z79899 Other long term (current) drug therapy: Secondary | ICD-10-CM | POA: Diagnosis not present

## 2020-07-06 DIAGNOSIS — G7129 Other congenital myopathy: Secondary | ICD-10-CM | POA: Diagnosis not present

## 2020-07-06 DIAGNOSIS — J4540 Moderate persistent asthma, uncomplicated: Secondary | ICD-10-CM | POA: Diagnosis not present

## 2020-07-06 DIAGNOSIS — Z930 Tracheostomy status: Secondary | ICD-10-CM | POA: Diagnosis not present

## 2020-07-06 DIAGNOSIS — Z9911 Dependence on respirator [ventilator] status: Secondary | ICD-10-CM | POA: Diagnosis not present

## 2020-07-06 DIAGNOSIS — J984 Other disorders of lung: Secondary | ICD-10-CM | POA: Diagnosis not present

## 2020-07-06 DIAGNOSIS — Q688 Other specified congenital musculoskeletal deformities: Secondary | ICD-10-CM | POA: Diagnosis not present

## 2020-07-06 DIAGNOSIS — M4145 Neuromuscular scoliosis, thoracolumbar region: Secondary | ICD-10-CM | POA: Diagnosis not present

## 2020-07-07 DIAGNOSIS — Q688 Other specified congenital musculoskeletal deformities: Secondary | ICD-10-CM | POA: Diagnosis not present

## 2020-07-10 DIAGNOSIS — Q688 Other specified congenital musculoskeletal deformities: Secondary | ICD-10-CM | POA: Diagnosis not present

## 2020-07-10 DIAGNOSIS — R279 Unspecified lack of coordination: Secondary | ICD-10-CM | POA: Diagnosis not present

## 2020-07-11 DIAGNOSIS — Q688 Other specified congenital musculoskeletal deformities: Secondary | ICD-10-CM | POA: Diagnosis not present

## 2020-07-12 ENCOUNTER — Other Ambulatory Visit (HOSPITAL_COMMUNITY): Payer: Self-pay | Admitting: Pediatrics

## 2020-07-12 DIAGNOSIS — Q688 Other specified congenital musculoskeletal deformities: Secondary | ICD-10-CM | POA: Diagnosis not present

## 2020-07-13 DIAGNOSIS — Q688 Other specified congenital musculoskeletal deformities: Secondary | ICD-10-CM | POA: Diagnosis not present

## 2020-07-14 DIAGNOSIS — Q688 Other specified congenital musculoskeletal deformities: Secondary | ICD-10-CM | POA: Diagnosis not present

## 2020-07-15 DIAGNOSIS — Q688 Other specified congenital musculoskeletal deformities: Secondary | ICD-10-CM | POA: Diagnosis not present

## 2020-07-17 DIAGNOSIS — Z931 Gastrostomy status: Secondary | ICD-10-CM | POA: Diagnosis not present

## 2020-07-17 DIAGNOSIS — Q688 Other specified congenital musculoskeletal deformities: Secondary | ICD-10-CM | POA: Diagnosis not present

## 2020-07-17 DIAGNOSIS — Z930 Tracheostomy status: Secondary | ICD-10-CM | POA: Diagnosis not present

## 2020-07-17 DIAGNOSIS — J9611 Chronic respiratory failure with hypoxia: Secondary | ICD-10-CM | POA: Diagnosis not present

## 2020-07-17 DIAGNOSIS — R279 Unspecified lack of coordination: Secondary | ICD-10-CM | POA: Diagnosis not present

## 2020-07-18 DIAGNOSIS — Q688 Other specified congenital musculoskeletal deformities: Secondary | ICD-10-CM | POA: Diagnosis not present

## 2020-07-19 DIAGNOSIS — Q688 Other specified congenital musculoskeletal deformities: Secondary | ICD-10-CM | POA: Diagnosis not present

## 2020-07-20 DIAGNOSIS — Q688 Other specified congenital musculoskeletal deformities: Secondary | ICD-10-CM | POA: Diagnosis not present

## 2020-07-21 DIAGNOSIS — Q688 Other specified congenital musculoskeletal deformities: Secondary | ICD-10-CM | POA: Diagnosis not present

## 2020-07-21 DIAGNOSIS — Z931 Gastrostomy status: Secondary | ICD-10-CM | POA: Diagnosis not present

## 2020-07-21 DIAGNOSIS — Z930 Tracheostomy status: Secondary | ICD-10-CM | POA: Diagnosis not present

## 2020-07-21 DIAGNOSIS — J9611 Chronic respiratory failure with hypoxia: Secondary | ICD-10-CM | POA: Diagnosis not present

## 2020-07-22 DIAGNOSIS — Q688 Other specified congenital musculoskeletal deformities: Secondary | ICD-10-CM | POA: Diagnosis not present

## 2020-07-23 DIAGNOSIS — Q688 Other specified congenital musculoskeletal deformities: Secondary | ICD-10-CM | POA: Diagnosis not present

## 2020-07-24 DIAGNOSIS — Q688 Other specified congenital musculoskeletal deformities: Secondary | ICD-10-CM | POA: Diagnosis not present

## 2020-07-24 DIAGNOSIS — R279 Unspecified lack of coordination: Secondary | ICD-10-CM | POA: Diagnosis not present

## 2020-07-25 DIAGNOSIS — J9611 Chronic respiratory failure with hypoxia: Secondary | ICD-10-CM | POA: Diagnosis not present

## 2020-07-25 DIAGNOSIS — Q688 Other specified congenital musculoskeletal deformities: Secondary | ICD-10-CM | POA: Diagnosis not present

## 2020-07-25 DIAGNOSIS — Z931 Gastrostomy status: Secondary | ICD-10-CM | POA: Diagnosis not present

## 2020-07-25 DIAGNOSIS — Z930 Tracheostomy status: Secondary | ICD-10-CM | POA: Diagnosis not present

## 2020-07-26 DIAGNOSIS — Q688 Other specified congenital musculoskeletal deformities: Secondary | ICD-10-CM | POA: Diagnosis not present

## 2020-07-27 DIAGNOSIS — Q688 Other specified congenital musculoskeletal deformities: Secondary | ICD-10-CM | POA: Diagnosis not present

## 2020-07-28 DIAGNOSIS — Q688 Other specified congenital musculoskeletal deformities: Secondary | ICD-10-CM | POA: Diagnosis not present

## 2020-07-30 DIAGNOSIS — Q688 Other specified congenital musculoskeletal deformities: Secondary | ICD-10-CM | POA: Diagnosis not present

## 2020-07-31 DIAGNOSIS — R279 Unspecified lack of coordination: Secondary | ICD-10-CM | POA: Diagnosis not present

## 2020-07-31 DIAGNOSIS — Q688 Other specified congenital musculoskeletal deformities: Secondary | ICD-10-CM | POA: Diagnosis not present

## 2020-08-01 DIAGNOSIS — Q688 Other specified congenital musculoskeletal deformities: Secondary | ICD-10-CM | POA: Diagnosis not present

## 2020-08-02 DIAGNOSIS — Q688 Other specified congenital musculoskeletal deformities: Secondary | ICD-10-CM | POA: Diagnosis not present

## 2020-08-03 DIAGNOSIS — Q688 Other specified congenital musculoskeletal deformities: Secondary | ICD-10-CM | POA: Diagnosis not present

## 2020-08-04 DIAGNOSIS — Q688 Other specified congenital musculoskeletal deformities: Secondary | ICD-10-CM | POA: Diagnosis not present

## 2020-08-04 DIAGNOSIS — Z713 Dietary counseling and surveillance: Secondary | ICD-10-CM | POA: Diagnosis not present

## 2020-08-04 DIAGNOSIS — Z931 Gastrostomy status: Secondary | ICD-10-CM | POA: Diagnosis not present

## 2020-08-04 DIAGNOSIS — Z68.41 Body mass index (BMI) pediatric, less than 5th percentile for age: Secondary | ICD-10-CM | POA: Diagnosis not present

## 2020-08-04 DIAGNOSIS — Z9911 Dependence on respirator [ventilator] status: Secondary | ICD-10-CM | POA: Diagnosis not present

## 2020-08-04 DIAGNOSIS — Z930 Tracheostomy status: Secondary | ICD-10-CM | POA: Diagnosis not present

## 2020-08-04 DIAGNOSIS — Q799 Congenital malformation of musculoskeletal system, unspecified: Secondary | ICD-10-CM | POA: Diagnosis not present

## 2020-08-04 DIAGNOSIS — Z0000 Encounter for general adult medical examination without abnormal findings: Secondary | ICD-10-CM | POA: Diagnosis not present

## 2020-08-06 DIAGNOSIS — Q688 Other specified congenital musculoskeletal deformities: Secondary | ICD-10-CM | POA: Diagnosis not present

## 2020-08-07 DIAGNOSIS — R279 Unspecified lack of coordination: Secondary | ICD-10-CM | POA: Diagnosis not present

## 2020-08-07 DIAGNOSIS — R2689 Other abnormalities of gait and mobility: Secondary | ICD-10-CM | POA: Diagnosis not present

## 2020-08-07 DIAGNOSIS — Q688 Other specified congenital musculoskeletal deformities: Secondary | ICD-10-CM | POA: Diagnosis not present

## 2020-08-08 DIAGNOSIS — Q688 Other specified congenital musculoskeletal deformities: Secondary | ICD-10-CM | POA: Diagnosis not present

## 2020-08-09 DIAGNOSIS — Q688 Other specified congenital musculoskeletal deformities: Secondary | ICD-10-CM | POA: Diagnosis not present

## 2020-08-10 DIAGNOSIS — Q688 Other specified congenital musculoskeletal deformities: Secondary | ICD-10-CM | POA: Diagnosis not present

## 2020-08-11 DIAGNOSIS — Q688 Other specified congenital musculoskeletal deformities: Secondary | ICD-10-CM | POA: Diagnosis not present

## 2020-08-13 DIAGNOSIS — Q688 Other specified congenital musculoskeletal deformities: Secondary | ICD-10-CM | POA: Diagnosis not present

## 2020-08-14 DIAGNOSIS — Q688 Other specified congenital musculoskeletal deformities: Secondary | ICD-10-CM | POA: Diagnosis not present

## 2020-08-14 DIAGNOSIS — R279 Unspecified lack of coordination: Secondary | ICD-10-CM | POA: Diagnosis not present

## 2020-08-15 DIAGNOSIS — Q688 Other specified congenital musculoskeletal deformities: Secondary | ICD-10-CM | POA: Diagnosis not present

## 2020-08-16 DIAGNOSIS — Q688 Other specified congenital musculoskeletal deformities: Secondary | ICD-10-CM | POA: Diagnosis not present

## 2020-08-17 DIAGNOSIS — J9611 Chronic respiratory failure with hypoxia: Secondary | ICD-10-CM | POA: Diagnosis not present

## 2020-08-17 DIAGNOSIS — Q688 Other specified congenital musculoskeletal deformities: Secondary | ICD-10-CM | POA: Diagnosis not present

## 2020-08-17 DIAGNOSIS — Z931 Gastrostomy status: Secondary | ICD-10-CM | POA: Diagnosis not present

## 2020-08-17 DIAGNOSIS — Z930 Tracheostomy status: Secondary | ICD-10-CM | POA: Diagnosis not present

## 2020-08-18 DIAGNOSIS — Q688 Other specified congenital musculoskeletal deformities: Secondary | ICD-10-CM | POA: Diagnosis not present

## 2020-08-19 DIAGNOSIS — Q688 Other specified congenital musculoskeletal deformities: Secondary | ICD-10-CM | POA: Diagnosis not present

## 2020-08-20 DIAGNOSIS — Q688 Other specified congenital musculoskeletal deformities: Secondary | ICD-10-CM | POA: Diagnosis not present

## 2020-08-21 DIAGNOSIS — Z930 Tracheostomy status: Secondary | ICD-10-CM | POA: Diagnosis not present

## 2020-08-21 DIAGNOSIS — J9611 Chronic respiratory failure with hypoxia: Secondary | ICD-10-CM | POA: Diagnosis not present

## 2020-08-21 DIAGNOSIS — Z931 Gastrostomy status: Secondary | ICD-10-CM | POA: Diagnosis not present

## 2020-08-21 DIAGNOSIS — Q688 Other specified congenital musculoskeletal deformities: Secondary | ICD-10-CM | POA: Diagnosis not present

## 2020-08-22 DIAGNOSIS — Q688 Other specified congenital musculoskeletal deformities: Secondary | ICD-10-CM | POA: Diagnosis not present

## 2020-08-22 DIAGNOSIS — R279 Unspecified lack of coordination: Secondary | ICD-10-CM | POA: Diagnosis not present

## 2020-08-23 ENCOUNTER — Other Ambulatory Visit (HOSPITAL_COMMUNITY): Payer: Self-pay

## 2020-08-23 DIAGNOSIS — Q688 Other specified congenital musculoskeletal deformities: Secondary | ICD-10-CM | POA: Diagnosis not present

## 2020-08-23 MED FILL — Fluticasone Propionate Nasal Susp 50 MCG/ACT: NASAL | 30 days supply | Qty: 16 | Fill #0 | Status: AC

## 2020-08-23 MED FILL — Albuterol Sulfate Soln Nebu 0.083% (2.5 MG/3ML): RESPIRATORY_TRACT | 8 days supply | Qty: 150 | Fill #0 | Status: AC

## 2020-08-23 MED FILL — Carbinoxamine Maleate Extended Release Susp 4 MG/5ML: ORAL | 30 days supply | Qty: 300 | Fill #0 | Status: AC

## 2020-08-23 MED FILL — Montelukast Sodium Tab 10 MG (Base Equiv): ORAL | 30 days supply | Qty: 30 | Fill #0 | Status: AC

## 2020-08-24 ENCOUNTER — Other Ambulatory Visit (HOSPITAL_COMMUNITY): Payer: Self-pay

## 2020-08-24 DIAGNOSIS — Q688 Other specified congenital musculoskeletal deformities: Secondary | ICD-10-CM | POA: Diagnosis not present

## 2020-08-25 DIAGNOSIS — Q688 Other specified congenital musculoskeletal deformities: Secondary | ICD-10-CM | POA: Diagnosis not present

## 2020-08-27 DIAGNOSIS — Z931 Gastrostomy status: Secondary | ICD-10-CM | POA: Diagnosis not present

## 2020-08-27 DIAGNOSIS — J9611 Chronic respiratory failure with hypoxia: Secondary | ICD-10-CM | POA: Diagnosis not present

## 2020-08-27 DIAGNOSIS — Z930 Tracheostomy status: Secondary | ICD-10-CM | POA: Diagnosis not present

## 2020-08-28 ENCOUNTER — Other Ambulatory Visit (HOSPITAL_COMMUNITY): Payer: Self-pay

## 2020-08-28 DIAGNOSIS — J9611 Chronic respiratory failure with hypoxia: Secondary | ICD-10-CM | POA: Diagnosis not present

## 2020-08-28 DIAGNOSIS — Z931 Gastrostomy status: Secondary | ICD-10-CM | POA: Diagnosis not present

## 2020-08-28 DIAGNOSIS — R279 Unspecified lack of coordination: Secondary | ICD-10-CM | POA: Diagnosis not present

## 2020-08-28 DIAGNOSIS — Q688 Other specified congenital musculoskeletal deformities: Secondary | ICD-10-CM | POA: Diagnosis not present

## 2020-08-28 DIAGNOSIS — Z930 Tracheostomy status: Secondary | ICD-10-CM | POA: Diagnosis not present

## 2020-08-29 ENCOUNTER — Other Ambulatory Visit (HOSPITAL_COMMUNITY): Payer: Self-pay

## 2020-08-29 DIAGNOSIS — Q688 Other specified congenital musculoskeletal deformities: Secondary | ICD-10-CM | POA: Diagnosis not present

## 2020-08-29 MED ORDER — SODIUM CHLORIDE 3 % IN NEBU
INHALATION_SOLUTION | RESPIRATORY_TRACT | 11 refills | Status: AC
  Filled 2020-08-29: qty 240, 15d supply, fill #0
  Filled 2020-10-02: qty 240, 15d supply, fill #1
  Filled 2020-10-10: qty 1200, 75d supply, fill #2

## 2020-08-30 DIAGNOSIS — Q688 Other specified congenital musculoskeletal deformities: Secondary | ICD-10-CM | POA: Diagnosis not present

## 2020-08-31 DIAGNOSIS — Q688 Other specified congenital musculoskeletal deformities: Secondary | ICD-10-CM | POA: Diagnosis not present

## 2020-09-01 ENCOUNTER — Other Ambulatory Visit (HOSPITAL_COMMUNITY): Payer: Self-pay

## 2020-09-01 DIAGNOSIS — Q688 Other specified congenital musculoskeletal deformities: Secondary | ICD-10-CM | POA: Diagnosis not present

## 2020-09-02 DIAGNOSIS — Q688 Other specified congenital musculoskeletal deformities: Secondary | ICD-10-CM | POA: Diagnosis not present

## 2020-09-03 DIAGNOSIS — Q688 Other specified congenital musculoskeletal deformities: Secondary | ICD-10-CM | POA: Diagnosis not present

## 2020-09-04 DIAGNOSIS — R279 Unspecified lack of coordination: Secondary | ICD-10-CM | POA: Diagnosis not present

## 2020-09-04 DIAGNOSIS — Q688 Other specified congenital musculoskeletal deformities: Secondary | ICD-10-CM | POA: Diagnosis not present

## 2020-09-05 ENCOUNTER — Other Ambulatory Visit (HOSPITAL_COMMUNITY): Payer: Self-pay

## 2020-09-05 DIAGNOSIS — Q688 Other specified congenital musculoskeletal deformities: Secondary | ICD-10-CM | POA: Diagnosis not present

## 2020-09-06 ENCOUNTER — Other Ambulatory Visit (HOSPITAL_COMMUNITY): Payer: Self-pay

## 2020-09-06 DIAGNOSIS — M4145 Neuromuscular scoliosis, thoracolumbar region: Secondary | ICD-10-CM | POA: Diagnosis not present

## 2020-09-06 DIAGNOSIS — M8588 Other specified disorders of bone density and structure, other site: Secondary | ICD-10-CM | POA: Diagnosis not present

## 2020-09-06 DIAGNOSIS — Q688 Other specified congenital musculoskeletal deformities: Secondary | ICD-10-CM | POA: Diagnosis not present

## 2020-09-06 MED ORDER — PROAIR HFA 108 (90 BASE) MCG/ACT IN AERS
INHALATION_SPRAY | RESPIRATORY_TRACT | 1 refills | Status: AC
  Filled 2020-09-06: qty 8.5, 17d supply, fill #0

## 2020-09-07 DIAGNOSIS — Q688 Other specified congenital musculoskeletal deformities: Secondary | ICD-10-CM | POA: Diagnosis not present

## 2020-09-08 ENCOUNTER — Other Ambulatory Visit (HOSPITAL_COMMUNITY): Payer: Self-pay

## 2020-09-08 DIAGNOSIS — Q688 Other specified congenital musculoskeletal deformities: Secondary | ICD-10-CM | POA: Diagnosis not present

## 2020-09-09 DIAGNOSIS — Q688 Other specified congenital musculoskeletal deformities: Secondary | ICD-10-CM | POA: Diagnosis not present

## 2020-09-10 DIAGNOSIS — Q688 Other specified congenital musculoskeletal deformities: Secondary | ICD-10-CM | POA: Diagnosis not present

## 2020-09-11 DIAGNOSIS — R279 Unspecified lack of coordination: Secondary | ICD-10-CM | POA: Diagnosis not present

## 2020-09-11 DIAGNOSIS — Q688 Other specified congenital musculoskeletal deformities: Secondary | ICD-10-CM | POA: Diagnosis not present

## 2020-09-12 DIAGNOSIS — Q688 Other specified congenital musculoskeletal deformities: Secondary | ICD-10-CM | POA: Diagnosis not present

## 2020-09-13 DIAGNOSIS — Q688 Other specified congenital musculoskeletal deformities: Secondary | ICD-10-CM | POA: Diagnosis not present

## 2020-09-14 DIAGNOSIS — Q688 Other specified congenital musculoskeletal deformities: Secondary | ICD-10-CM | POA: Diagnosis not present

## 2020-09-15 DIAGNOSIS — Q688 Other specified congenital musculoskeletal deformities: Secondary | ICD-10-CM | POA: Diagnosis not present

## 2020-09-16 DIAGNOSIS — Z931 Gastrostomy status: Secondary | ICD-10-CM | POA: Diagnosis not present

## 2020-09-16 DIAGNOSIS — Z930 Tracheostomy status: Secondary | ICD-10-CM | POA: Diagnosis not present

## 2020-09-16 DIAGNOSIS — J9611 Chronic respiratory failure with hypoxia: Secondary | ICD-10-CM | POA: Diagnosis not present

## 2020-09-17 DIAGNOSIS — Q688 Other specified congenital musculoskeletal deformities: Secondary | ICD-10-CM | POA: Diagnosis not present

## 2020-09-18 DIAGNOSIS — R279 Unspecified lack of coordination: Secondary | ICD-10-CM | POA: Diagnosis not present

## 2020-09-18 DIAGNOSIS — Q688 Other specified congenital musculoskeletal deformities: Secondary | ICD-10-CM | POA: Diagnosis not present

## 2020-09-18 MED FILL — Albuterol Sulfate Soln Nebu 0.083% (2.5 MG/3ML): RESPIRATORY_TRACT | 8 days supply | Qty: 150 | Fill #1 | Status: AC

## 2020-09-18 MED FILL — Carbinoxamine Maleate Extended Release Susp 4 MG/5ML: ORAL | 30 days supply | Qty: 300 | Fill #1 | Status: AC

## 2020-09-19 ENCOUNTER — Other Ambulatory Visit (HOSPITAL_COMMUNITY): Payer: Self-pay

## 2020-09-19 DIAGNOSIS — Q688 Other specified congenital musculoskeletal deformities: Secondary | ICD-10-CM | POA: Diagnosis not present

## 2020-09-20 DIAGNOSIS — Z931 Gastrostomy status: Secondary | ICD-10-CM | POA: Diagnosis not present

## 2020-09-20 DIAGNOSIS — J9611 Chronic respiratory failure with hypoxia: Secondary | ICD-10-CM | POA: Diagnosis not present

## 2020-09-20 DIAGNOSIS — Q688 Other specified congenital musculoskeletal deformities: Secondary | ICD-10-CM | POA: Diagnosis not present

## 2020-09-20 DIAGNOSIS — Z930 Tracheostomy status: Secondary | ICD-10-CM | POA: Diagnosis not present

## 2020-09-21 DIAGNOSIS — Q688 Other specified congenital musculoskeletal deformities: Secondary | ICD-10-CM | POA: Diagnosis not present

## 2020-09-22 DIAGNOSIS — Q688 Other specified congenital musculoskeletal deformities: Secondary | ICD-10-CM | POA: Diagnosis not present

## 2020-09-24 DIAGNOSIS — Q688 Other specified congenital musculoskeletal deformities: Secondary | ICD-10-CM | POA: Diagnosis not present

## 2020-09-25 DIAGNOSIS — Q688 Other specified congenital musculoskeletal deformities: Secondary | ICD-10-CM | POA: Diagnosis not present

## 2020-09-25 DIAGNOSIS — R279 Unspecified lack of coordination: Secondary | ICD-10-CM | POA: Diagnosis not present

## 2020-09-26 ENCOUNTER — Other Ambulatory Visit (HOSPITAL_COMMUNITY): Payer: Self-pay

## 2020-09-26 DIAGNOSIS — Q688 Other specified congenital musculoskeletal deformities: Secondary | ICD-10-CM | POA: Diagnosis not present

## 2020-09-26 MED FILL — Fluticasone Propionate HFA Inhal Aer 110 MCG/ACT: RESPIRATORY_TRACT | 30 days supply | Qty: 12 | Fill #0 | Status: AC

## 2020-09-26 MED FILL — Montelukast Sodium Tab 10 MG (Base Equiv): ORAL | 30 days supply | Qty: 30 | Fill #1 | Status: AC

## 2020-09-26 MED FILL — Famotidine Tab 20 MG: ORAL | 30 days supply | Qty: 30 | Fill #0 | Status: AC

## 2020-09-26 MED FILL — Fluticasone Propionate Nasal Susp 50 MCG/ACT: NASAL | 30 days supply | Qty: 16 | Fill #1 | Status: AC

## 2020-09-27 DIAGNOSIS — Z931 Gastrostomy status: Secondary | ICD-10-CM | POA: Diagnosis not present

## 2020-09-27 DIAGNOSIS — Q688 Other specified congenital musculoskeletal deformities: Secondary | ICD-10-CM | POA: Diagnosis not present

## 2020-09-27 DIAGNOSIS — Z930 Tracheostomy status: Secondary | ICD-10-CM | POA: Diagnosis not present

## 2020-09-27 DIAGNOSIS — J9611 Chronic respiratory failure with hypoxia: Secondary | ICD-10-CM | POA: Diagnosis not present

## 2020-09-28 DIAGNOSIS — Q688 Other specified congenital musculoskeletal deformities: Secondary | ICD-10-CM | POA: Diagnosis not present

## 2020-09-29 DIAGNOSIS — Q688 Other specified congenital musculoskeletal deformities: Secondary | ICD-10-CM | POA: Diagnosis not present

## 2020-10-01 DIAGNOSIS — Q688 Other specified congenital musculoskeletal deformities: Secondary | ICD-10-CM | POA: Diagnosis not present

## 2020-10-02 ENCOUNTER — Other Ambulatory Visit (HOSPITAL_COMMUNITY): Payer: Self-pay

## 2020-10-02 DIAGNOSIS — Q688 Other specified congenital musculoskeletal deformities: Secondary | ICD-10-CM | POA: Diagnosis not present

## 2020-10-02 DIAGNOSIS — R279 Unspecified lack of coordination: Secondary | ICD-10-CM | POA: Diagnosis not present

## 2020-10-03 DIAGNOSIS — Q688 Other specified congenital musculoskeletal deformities: Secondary | ICD-10-CM | POA: Diagnosis not present

## 2020-10-04 DIAGNOSIS — Q688 Other specified congenital musculoskeletal deformities: Secondary | ICD-10-CM | POA: Diagnosis not present

## 2020-10-05 DIAGNOSIS — Z931 Gastrostomy status: Secondary | ICD-10-CM | POA: Diagnosis not present

## 2020-10-05 DIAGNOSIS — J9611 Chronic respiratory failure with hypoxia: Secondary | ICD-10-CM | POA: Diagnosis not present

## 2020-10-05 DIAGNOSIS — Z930 Tracheostomy status: Secondary | ICD-10-CM | POA: Diagnosis not present

## 2020-10-05 DIAGNOSIS — Q688 Other specified congenital musculoskeletal deformities: Secondary | ICD-10-CM | POA: Diagnosis not present

## 2020-10-06 ENCOUNTER — Other Ambulatory Visit (HOSPITAL_COMMUNITY): Payer: Self-pay

## 2020-10-06 DIAGNOSIS — Z930 Tracheostomy status: Secondary | ICD-10-CM | POA: Diagnosis not present

## 2020-10-06 DIAGNOSIS — I890 Lymphedema, not elsewhere classified: Secondary | ICD-10-CM | POA: Diagnosis not present

## 2020-10-06 DIAGNOSIS — J984 Other disorders of lung: Secondary | ICD-10-CM | POA: Diagnosis not present

## 2020-10-06 DIAGNOSIS — R030 Elevated blood-pressure reading, without diagnosis of hypertension: Secondary | ICD-10-CM | POA: Diagnosis not present

## 2020-10-06 DIAGNOSIS — G7129 Other congenital myopathy: Secondary | ICD-10-CM | POA: Diagnosis not present

## 2020-10-06 DIAGNOSIS — J4540 Moderate persistent asthma, uncomplicated: Secondary | ICD-10-CM | POA: Diagnosis not present

## 2020-10-06 DIAGNOSIS — Q688 Other specified congenital musculoskeletal deformities: Secondary | ICD-10-CM | POA: Diagnosis not present

## 2020-10-06 DIAGNOSIS — Z1589 Genetic susceptibility to other disease: Secondary | ICD-10-CM | POA: Diagnosis not present

## 2020-10-06 DIAGNOSIS — Z931 Gastrostomy status: Secondary | ICD-10-CM | POA: Diagnosis not present

## 2020-10-06 DIAGNOSIS — R000 Tachycardia, unspecified: Secondary | ICD-10-CM | POA: Diagnosis not present

## 2020-10-06 DIAGNOSIS — M4145 Neuromuscular scoliosis, thoracolumbar region: Secondary | ICD-10-CM | POA: Diagnosis not present

## 2020-10-06 DIAGNOSIS — M6281 Muscle weakness (generalized): Secondary | ICD-10-CM | POA: Diagnosis not present

## 2020-10-06 DIAGNOSIS — Z9911 Dependence on respirator [ventilator] status: Secondary | ICD-10-CM | POA: Diagnosis not present

## 2020-10-06 MED ORDER — BUDESONIDE-FORMOTEROL FUMARATE 80-4.5 MCG/ACT IN AERO
INHALATION_SPRAY | RESPIRATORY_TRACT | 12 refills | Status: AC
  Filled 2020-10-06: qty 10.2, 30d supply, fill #0
  Filled 2020-11-05: qty 10.2, 30d supply, fill #1
  Filled 2020-12-04: qty 10.2, 30d supply, fill #2
  Filled 2020-12-27: qty 10.2, 30d supply, fill #0
  Filled 2020-12-27: qty 10.2, 30d supply, fill #3
  Filled 2021-01-25: qty 10.2, 30d supply, fill #1
  Filled 2021-02-28: qty 10.2, 30d supply, fill #2
  Filled 2021-03-25: qty 10.2, 30d supply, fill #3
  Filled 2021-04-23: qty 10.2, 30d supply, fill #4
  Filled 2021-07-24: qty 10.2, 30d supply, fill #5

## 2020-10-07 DIAGNOSIS — Q688 Other specified congenital musculoskeletal deformities: Secondary | ICD-10-CM | POA: Diagnosis not present

## 2020-10-08 DIAGNOSIS — Q688 Other specified congenital musculoskeletal deformities: Secondary | ICD-10-CM | POA: Diagnosis not present

## 2020-10-09 DIAGNOSIS — Q688 Other specified congenital musculoskeletal deformities: Secondary | ICD-10-CM | POA: Diagnosis not present

## 2020-10-10 ENCOUNTER — Other Ambulatory Visit (HOSPITAL_COMMUNITY): Payer: Self-pay

## 2020-10-10 DIAGNOSIS — Q688 Other specified congenital musculoskeletal deformities: Secondary | ICD-10-CM | POA: Diagnosis not present

## 2020-10-11 DIAGNOSIS — Q688 Other specified congenital musculoskeletal deformities: Secondary | ICD-10-CM | POA: Diagnosis not present

## 2020-10-12 DIAGNOSIS — Q688 Other specified congenital musculoskeletal deformities: Secondary | ICD-10-CM | POA: Diagnosis not present

## 2020-10-13 DIAGNOSIS — Q688 Other specified congenital musculoskeletal deformities: Secondary | ICD-10-CM | POA: Diagnosis not present

## 2020-10-14 DIAGNOSIS — Q688 Other specified congenital musculoskeletal deformities: Secondary | ICD-10-CM | POA: Diagnosis not present

## 2020-10-15 DIAGNOSIS — Q688 Other specified congenital musculoskeletal deformities: Secondary | ICD-10-CM | POA: Diagnosis not present

## 2020-10-16 ENCOUNTER — Other Ambulatory Visit (HOSPITAL_COMMUNITY): Payer: Self-pay

## 2020-10-16 DIAGNOSIS — R279 Unspecified lack of coordination: Secondary | ICD-10-CM | POA: Diagnosis not present

## 2020-10-16 DIAGNOSIS — Q688 Other specified congenital musculoskeletal deformities: Secondary | ICD-10-CM | POA: Diagnosis not present

## 2020-10-17 DIAGNOSIS — Z931 Gastrostomy status: Secondary | ICD-10-CM | POA: Diagnosis not present

## 2020-10-17 DIAGNOSIS — Q688 Other specified congenital musculoskeletal deformities: Secondary | ICD-10-CM | POA: Diagnosis not present

## 2020-10-17 DIAGNOSIS — J9611 Chronic respiratory failure with hypoxia: Secondary | ICD-10-CM | POA: Diagnosis not present

## 2020-10-17 DIAGNOSIS — Z930 Tracheostomy status: Secondary | ICD-10-CM | POA: Diagnosis not present

## 2020-10-18 DIAGNOSIS — Q688 Other specified congenital musculoskeletal deformities: Secondary | ICD-10-CM | POA: Diagnosis not present

## 2020-10-19 DIAGNOSIS — Q688 Other specified congenital musculoskeletal deformities: Secondary | ICD-10-CM | POA: Diagnosis not present

## 2020-10-20 DIAGNOSIS — Q688 Other specified congenital musculoskeletal deformities: Secondary | ICD-10-CM | POA: Diagnosis not present

## 2020-10-21 DIAGNOSIS — Q688 Other specified congenital musculoskeletal deformities: Secondary | ICD-10-CM | POA: Diagnosis not present

## 2020-10-21 DIAGNOSIS — Z931 Gastrostomy status: Secondary | ICD-10-CM | POA: Diagnosis not present

## 2020-10-21 DIAGNOSIS — Z930 Tracheostomy status: Secondary | ICD-10-CM | POA: Diagnosis not present

## 2020-10-21 DIAGNOSIS — J9611 Chronic respiratory failure with hypoxia: Secondary | ICD-10-CM | POA: Diagnosis not present

## 2020-10-22 DIAGNOSIS — Q688 Other specified congenital musculoskeletal deformities: Secondary | ICD-10-CM | POA: Diagnosis not present

## 2020-10-23 DIAGNOSIS — R279 Unspecified lack of coordination: Secondary | ICD-10-CM | POA: Diagnosis not present

## 2020-10-23 DIAGNOSIS — Q688 Other specified congenital musculoskeletal deformities: Secondary | ICD-10-CM | POA: Diagnosis not present

## 2020-10-24 DIAGNOSIS — Q688 Other specified congenital musculoskeletal deformities: Secondary | ICD-10-CM | POA: Diagnosis not present

## 2020-10-25 ENCOUNTER — Other Ambulatory Visit (HOSPITAL_COMMUNITY): Payer: Self-pay

## 2020-10-25 DIAGNOSIS — Q688 Other specified congenital musculoskeletal deformities: Secondary | ICD-10-CM | POA: Diagnosis not present

## 2020-10-25 MED FILL — Carbinoxamine Maleate Extended Release Susp 4 MG/5ML: ORAL | 30 days supply | Qty: 300 | Fill #2 | Status: AC

## 2020-10-25 MED FILL — Montelukast Sodium Tab 10 MG (Base Equiv): ORAL | 30 days supply | Qty: 30 | Fill #2 | Status: AC

## 2020-10-26 DIAGNOSIS — Q688 Other specified congenital musculoskeletal deformities: Secondary | ICD-10-CM | POA: Diagnosis not present

## 2020-10-27 DIAGNOSIS — Z931 Gastrostomy status: Secondary | ICD-10-CM | POA: Diagnosis not present

## 2020-10-27 DIAGNOSIS — J9611 Chronic respiratory failure with hypoxia: Secondary | ICD-10-CM | POA: Diagnosis not present

## 2020-10-27 DIAGNOSIS — Z930 Tracheostomy status: Secondary | ICD-10-CM | POA: Diagnosis not present

## 2020-10-27 DIAGNOSIS — Q688 Other specified congenital musculoskeletal deformities: Secondary | ICD-10-CM | POA: Diagnosis not present

## 2020-10-30 DIAGNOSIS — Q688 Other specified congenital musculoskeletal deformities: Secondary | ICD-10-CM | POA: Diagnosis not present

## 2020-10-31 DIAGNOSIS — Q688 Other specified congenital musculoskeletal deformities: Secondary | ICD-10-CM | POA: Diagnosis not present

## 2020-11-01 DIAGNOSIS — Q688 Other specified congenital musculoskeletal deformities: Secondary | ICD-10-CM | POA: Diagnosis not present

## 2020-11-02 DIAGNOSIS — Q688 Other specified congenital musculoskeletal deformities: Secondary | ICD-10-CM | POA: Diagnosis not present

## 2020-11-03 DIAGNOSIS — Q688 Other specified congenital musculoskeletal deformities: Secondary | ICD-10-CM | POA: Diagnosis not present

## 2020-11-05 DIAGNOSIS — Q688 Other specified congenital musculoskeletal deformities: Secondary | ICD-10-CM | POA: Diagnosis not present

## 2020-11-06 ENCOUNTER — Other Ambulatory Visit (HOSPITAL_COMMUNITY): Payer: Self-pay

## 2020-11-06 DIAGNOSIS — R279 Unspecified lack of coordination: Secondary | ICD-10-CM | POA: Diagnosis not present

## 2020-11-06 DIAGNOSIS — Q688 Other specified congenital musculoskeletal deformities: Secondary | ICD-10-CM | POA: Diagnosis not present

## 2020-11-07 DIAGNOSIS — Q688 Other specified congenital musculoskeletal deformities: Secondary | ICD-10-CM | POA: Diagnosis not present

## 2020-11-08 DIAGNOSIS — Q688 Other specified congenital musculoskeletal deformities: Secondary | ICD-10-CM | POA: Diagnosis not present

## 2020-11-09 DIAGNOSIS — Q688 Other specified congenital musculoskeletal deformities: Secondary | ICD-10-CM | POA: Diagnosis not present

## 2020-11-10 DIAGNOSIS — Q688 Other specified congenital musculoskeletal deformities: Secondary | ICD-10-CM | POA: Diagnosis not present

## 2020-11-11 DIAGNOSIS — Q688 Other specified congenital musculoskeletal deformities: Secondary | ICD-10-CM | POA: Diagnosis not present

## 2020-11-12 DIAGNOSIS — Q688 Other specified congenital musculoskeletal deformities: Secondary | ICD-10-CM | POA: Diagnosis not present

## 2020-11-13 DIAGNOSIS — Q688 Other specified congenital musculoskeletal deformities: Secondary | ICD-10-CM | POA: Diagnosis not present

## 2020-11-13 DIAGNOSIS — R279 Unspecified lack of coordination: Secondary | ICD-10-CM | POA: Diagnosis not present

## 2020-11-14 DIAGNOSIS — Q688 Other specified congenital musculoskeletal deformities: Secondary | ICD-10-CM | POA: Diagnosis not present

## 2020-11-15 DIAGNOSIS — Q688 Other specified congenital musculoskeletal deformities: Secondary | ICD-10-CM | POA: Diagnosis not present

## 2020-11-16 ENCOUNTER — Other Ambulatory Visit (HOSPITAL_COMMUNITY): Payer: Self-pay

## 2020-11-16 DIAGNOSIS — Z930 Tracheostomy status: Secondary | ICD-10-CM | POA: Diagnosis not present

## 2020-11-16 DIAGNOSIS — Q688 Other specified congenital musculoskeletal deformities: Secondary | ICD-10-CM | POA: Diagnosis not present

## 2020-11-16 DIAGNOSIS — Z931 Gastrostomy status: Secondary | ICD-10-CM | POA: Diagnosis not present

## 2020-11-16 DIAGNOSIS — J9611 Chronic respiratory failure with hypoxia: Secondary | ICD-10-CM | POA: Diagnosis not present

## 2020-11-16 MED FILL — Montelukast Sodium Tab 10 MG (Base Equiv): ORAL | 30 days supply | Qty: 30 | Fill #3 | Status: AC

## 2020-11-16 MED FILL — Carbinoxamine Maleate Extended Release Susp 4 MG/5ML: ORAL | 30 days supply | Qty: 300 | Fill #3 | Status: AC

## 2020-11-17 DIAGNOSIS — Q688 Other specified congenital musculoskeletal deformities: Secondary | ICD-10-CM | POA: Diagnosis not present

## 2020-11-18 DIAGNOSIS — Q688 Other specified congenital musculoskeletal deformities: Secondary | ICD-10-CM | POA: Diagnosis not present

## 2020-11-19 DIAGNOSIS — Q688 Other specified congenital musculoskeletal deformities: Secondary | ICD-10-CM | POA: Diagnosis not present

## 2020-11-20 ENCOUNTER — Other Ambulatory Visit (HOSPITAL_COMMUNITY): Payer: Self-pay

## 2020-11-20 DIAGNOSIS — Z930 Tracheostomy status: Secondary | ICD-10-CM | POA: Diagnosis not present

## 2020-11-20 DIAGNOSIS — Z931 Gastrostomy status: Secondary | ICD-10-CM | POA: Diagnosis not present

## 2020-11-20 DIAGNOSIS — J9611 Chronic respiratory failure with hypoxia: Secondary | ICD-10-CM | POA: Diagnosis not present

## 2020-11-20 DIAGNOSIS — Q688 Other specified congenital musculoskeletal deformities: Secondary | ICD-10-CM | POA: Diagnosis not present

## 2020-11-21 DIAGNOSIS — Q688 Other specified congenital musculoskeletal deformities: Secondary | ICD-10-CM | POA: Diagnosis not present

## 2020-11-22 DIAGNOSIS — Q688 Other specified congenital musculoskeletal deformities: Secondary | ICD-10-CM | POA: Diagnosis not present

## 2020-11-23 DIAGNOSIS — Q688 Other specified congenital musculoskeletal deformities: Secondary | ICD-10-CM | POA: Diagnosis not present

## 2020-11-24 ENCOUNTER — Other Ambulatory Visit (HOSPITAL_COMMUNITY): Payer: Self-pay

## 2020-11-24 DIAGNOSIS — Q688 Other specified congenital musculoskeletal deformities: Secondary | ICD-10-CM | POA: Diagnosis not present

## 2020-11-25 DIAGNOSIS — Q688 Other specified congenital musculoskeletal deformities: Secondary | ICD-10-CM | POA: Diagnosis not present

## 2020-11-26 DIAGNOSIS — Q688 Other specified congenital musculoskeletal deformities: Secondary | ICD-10-CM | POA: Diagnosis not present

## 2020-11-27 ENCOUNTER — Other Ambulatory Visit (HOSPITAL_COMMUNITY): Payer: Self-pay

## 2020-11-27 DIAGNOSIS — Q688 Other specified congenital musculoskeletal deformities: Secondary | ICD-10-CM | POA: Diagnosis not present

## 2020-11-27 DIAGNOSIS — J9611 Chronic respiratory failure with hypoxia: Secondary | ICD-10-CM | POA: Diagnosis not present

## 2020-11-27 DIAGNOSIS — Z930 Tracheostomy status: Secondary | ICD-10-CM | POA: Diagnosis not present

## 2020-11-27 DIAGNOSIS — Z931 Gastrostomy status: Secondary | ICD-10-CM | POA: Diagnosis not present

## 2020-11-27 DIAGNOSIS — R279 Unspecified lack of coordination: Secondary | ICD-10-CM | POA: Diagnosis not present

## 2020-11-28 ENCOUNTER — Other Ambulatory Visit (HOSPITAL_COMMUNITY): Payer: Self-pay

## 2020-11-28 DIAGNOSIS — Q688 Other specified congenital musculoskeletal deformities: Secondary | ICD-10-CM | POA: Diagnosis not present

## 2020-11-29 ENCOUNTER — Other Ambulatory Visit (HOSPITAL_COMMUNITY): Payer: Self-pay

## 2020-11-29 DIAGNOSIS — Q688 Other specified congenital musculoskeletal deformities: Secondary | ICD-10-CM | POA: Diagnosis not present

## 2020-11-29 DIAGNOSIS — J9611 Chronic respiratory failure with hypoxia: Secondary | ICD-10-CM | POA: Diagnosis not present

## 2020-11-29 DIAGNOSIS — Z930 Tracheostomy status: Secondary | ICD-10-CM | POA: Diagnosis not present

## 2020-11-29 DIAGNOSIS — Z931 Gastrostomy status: Secondary | ICD-10-CM | POA: Diagnosis not present

## 2020-11-30 ENCOUNTER — Other Ambulatory Visit (HOSPITAL_COMMUNITY): Payer: Self-pay

## 2020-11-30 DIAGNOSIS — Q688 Other specified congenital musculoskeletal deformities: Secondary | ICD-10-CM | POA: Diagnosis not present

## 2020-12-01 ENCOUNTER — Other Ambulatory Visit (HOSPITAL_COMMUNITY): Payer: Self-pay

## 2020-12-02 DIAGNOSIS — Q688 Other specified congenital musculoskeletal deformities: Secondary | ICD-10-CM | POA: Diagnosis not present

## 2020-12-03 DIAGNOSIS — Q688 Other specified congenital musculoskeletal deformities: Secondary | ICD-10-CM | POA: Diagnosis not present

## 2020-12-04 ENCOUNTER — Other Ambulatory Visit (HOSPITAL_COMMUNITY): Payer: Self-pay

## 2020-12-04 DIAGNOSIS — R279 Unspecified lack of coordination: Secondary | ICD-10-CM | POA: Diagnosis not present

## 2020-12-04 DIAGNOSIS — Q688 Other specified congenital musculoskeletal deformities: Secondary | ICD-10-CM | POA: Diagnosis not present

## 2020-12-04 MED FILL — Montelukast Sodium Tab 10 MG (Base Equiv): ORAL | 30 days supply | Qty: 30 | Fill #4 | Status: CN

## 2020-12-05 ENCOUNTER — Other Ambulatory Visit (HOSPITAL_COMMUNITY): Payer: Self-pay

## 2020-12-05 DIAGNOSIS — Q688 Other specified congenital musculoskeletal deformities: Secondary | ICD-10-CM | POA: Diagnosis not present

## 2020-12-05 MED ORDER — FLUTICASONE PROPIONATE 50 MCG/ACT NA SUSP
1.0000 | Freq: Every day | NASAL | 8 refills | Status: AC
  Filled 2020-12-05: qty 16, 30d supply, fill #0
  Filled 2021-02-28: qty 16, 30d supply, fill #1
  Filled 2021-04-10: qty 16, 30d supply, fill #2

## 2020-12-06 DIAGNOSIS — Q688 Other specified congenital musculoskeletal deformities: Secondary | ICD-10-CM | POA: Diagnosis not present

## 2020-12-07 DIAGNOSIS — Q688 Other specified congenital musculoskeletal deformities: Secondary | ICD-10-CM | POA: Diagnosis not present

## 2020-12-08 DIAGNOSIS — Z930 Tracheostomy status: Secondary | ICD-10-CM | POA: Diagnosis not present

## 2020-12-08 DIAGNOSIS — Z931 Gastrostomy status: Secondary | ICD-10-CM | POA: Diagnosis not present

## 2020-12-08 DIAGNOSIS — Q688 Other specified congenital musculoskeletal deformities: Secondary | ICD-10-CM | POA: Diagnosis not present

## 2020-12-08 DIAGNOSIS — J9611 Chronic respiratory failure with hypoxia: Secondary | ICD-10-CM | POA: Diagnosis not present

## 2020-12-09 DIAGNOSIS — Q688 Other specified congenital musculoskeletal deformities: Secondary | ICD-10-CM | POA: Diagnosis not present

## 2020-12-10 DIAGNOSIS — Q688 Other specified congenital musculoskeletal deformities: Secondary | ICD-10-CM | POA: Diagnosis not present

## 2020-12-11 ENCOUNTER — Other Ambulatory Visit (HOSPITAL_COMMUNITY): Payer: Self-pay

## 2020-12-11 DIAGNOSIS — Q688 Other specified congenital musculoskeletal deformities: Secondary | ICD-10-CM | POA: Diagnosis not present

## 2020-12-11 DIAGNOSIS — H9202 Otalgia, left ear: Secondary | ICD-10-CM | POA: Diagnosis not present

## 2020-12-11 MED ORDER — CIPROFLOXACIN-DEXAMETHASONE 0.3-0.1 % OT SUSP
4.0000 [drp] | Freq: Two times a day (BID) | OTIC | 1 refills | Status: AC
  Filled 2020-12-11: qty 7.5, 19d supply, fill #0

## 2020-12-12 DIAGNOSIS — Q688 Other specified congenital musculoskeletal deformities: Secondary | ICD-10-CM | POA: Diagnosis not present

## 2020-12-12 DIAGNOSIS — R279 Unspecified lack of coordination: Secondary | ICD-10-CM | POA: Diagnosis not present

## 2020-12-13 DIAGNOSIS — Q688 Other specified congenital musculoskeletal deformities: Secondary | ICD-10-CM | POA: Diagnosis not present

## 2020-12-14 DIAGNOSIS — Q688 Other specified congenital musculoskeletal deformities: Secondary | ICD-10-CM | POA: Diagnosis not present

## 2020-12-15 ENCOUNTER — Other Ambulatory Visit (HOSPITAL_COMMUNITY): Payer: Self-pay

## 2020-12-15 DIAGNOSIS — Q688 Other specified congenital musculoskeletal deformities: Secondary | ICD-10-CM | POA: Diagnosis not present

## 2020-12-15 MED FILL — Carbinoxamine Maleate Extended Release Susp 4 MG/5ML: ORAL | 30 days supply | Qty: 300 | Fill #4 | Status: AC

## 2020-12-17 DIAGNOSIS — Q688 Other specified congenital musculoskeletal deformities: Secondary | ICD-10-CM | POA: Diagnosis not present

## 2020-12-17 DIAGNOSIS — Z931 Gastrostomy status: Secondary | ICD-10-CM | POA: Diagnosis not present

## 2020-12-17 DIAGNOSIS — J9611 Chronic respiratory failure with hypoxia: Secondary | ICD-10-CM | POA: Diagnosis not present

## 2020-12-17 DIAGNOSIS — Z930 Tracheostomy status: Secondary | ICD-10-CM | POA: Diagnosis not present

## 2020-12-18 ENCOUNTER — Other Ambulatory Visit (HOSPITAL_COMMUNITY): Payer: Self-pay

## 2020-12-18 DIAGNOSIS — Q688 Other specified congenital musculoskeletal deformities: Secondary | ICD-10-CM | POA: Diagnosis not present

## 2020-12-19 DIAGNOSIS — R279 Unspecified lack of coordination: Secondary | ICD-10-CM | POA: Diagnosis not present

## 2020-12-19 DIAGNOSIS — Q688 Other specified congenital musculoskeletal deformities: Secondary | ICD-10-CM | POA: Diagnosis not present

## 2020-12-20 ENCOUNTER — Other Ambulatory Visit (HOSPITAL_COMMUNITY): Payer: Self-pay

## 2020-12-20 DIAGNOSIS — Q688 Other specified congenital musculoskeletal deformities: Secondary | ICD-10-CM | POA: Diagnosis not present

## 2020-12-20 MED FILL — Montelukast Sodium Tab 10 MG (Base Equiv): ORAL | 30 days supply | Qty: 30 | Fill #4 | Status: AC

## 2020-12-21 DIAGNOSIS — Z930 Tracheostomy status: Secondary | ICD-10-CM | POA: Diagnosis not present

## 2020-12-21 DIAGNOSIS — Z931 Gastrostomy status: Secondary | ICD-10-CM | POA: Diagnosis not present

## 2020-12-21 DIAGNOSIS — Q688 Other specified congenital musculoskeletal deformities: Secondary | ICD-10-CM | POA: Diagnosis not present

## 2020-12-21 DIAGNOSIS — J9611 Chronic respiratory failure with hypoxia: Secondary | ICD-10-CM | POA: Diagnosis not present

## 2020-12-22 DIAGNOSIS — Q688 Other specified congenital musculoskeletal deformities: Secondary | ICD-10-CM | POA: Diagnosis not present

## 2020-12-24 DIAGNOSIS — Q688 Other specified congenital musculoskeletal deformities: Secondary | ICD-10-CM | POA: Diagnosis not present

## 2020-12-25 DIAGNOSIS — Q688 Other specified congenital musculoskeletal deformities: Secondary | ICD-10-CM | POA: Diagnosis not present

## 2020-12-25 DIAGNOSIS — Z9911 Dependence on respirator [ventilator] status: Secondary | ICD-10-CM | POA: Diagnosis not present

## 2020-12-25 DIAGNOSIS — M4140 Neuromuscular scoliosis, site unspecified: Secondary | ICD-10-CM | POA: Diagnosis not present

## 2020-12-25 DIAGNOSIS — R279 Unspecified lack of coordination: Secondary | ICD-10-CM | POA: Diagnosis not present

## 2020-12-25 DIAGNOSIS — Q799 Congenital malformation of musculoskeletal system, unspecified: Secondary | ICD-10-CM | POA: Diagnosis not present

## 2020-12-26 DIAGNOSIS — Z931 Gastrostomy status: Secondary | ICD-10-CM | POA: Diagnosis not present

## 2020-12-26 DIAGNOSIS — Q688 Other specified congenital musculoskeletal deformities: Secondary | ICD-10-CM | POA: Diagnosis not present

## 2020-12-26 DIAGNOSIS — Z930 Tracheostomy status: Secondary | ICD-10-CM | POA: Diagnosis not present

## 2020-12-26 DIAGNOSIS — J9611 Chronic respiratory failure with hypoxia: Secondary | ICD-10-CM | POA: Diagnosis not present

## 2020-12-27 ENCOUNTER — Other Ambulatory Visit (HOSPITAL_COMMUNITY): Payer: Self-pay

## 2020-12-27 DIAGNOSIS — Q688 Other specified congenital musculoskeletal deformities: Secondary | ICD-10-CM | POA: Diagnosis not present

## 2020-12-28 ENCOUNTER — Other Ambulatory Visit (HOSPITAL_COMMUNITY): Payer: Self-pay

## 2020-12-28 DIAGNOSIS — Z931 Gastrostomy status: Secondary | ICD-10-CM | POA: Diagnosis not present

## 2020-12-28 DIAGNOSIS — Z930 Tracheostomy status: Secondary | ICD-10-CM | POA: Diagnosis not present

## 2020-12-28 DIAGNOSIS — J9611 Chronic respiratory failure with hypoxia: Secondary | ICD-10-CM | POA: Diagnosis not present

## 2020-12-28 DIAGNOSIS — Q688 Other specified congenital musculoskeletal deformities: Secondary | ICD-10-CM | POA: Diagnosis not present

## 2020-12-29 DIAGNOSIS — Q688 Other specified congenital musculoskeletal deformities: Secondary | ICD-10-CM | POA: Diagnosis not present

## 2021-01-06 DIAGNOSIS — Q688 Other specified congenital musculoskeletal deformities: Secondary | ICD-10-CM | POA: Diagnosis not present

## 2021-01-07 DIAGNOSIS — Q688 Other specified congenital musculoskeletal deformities: Secondary | ICD-10-CM | POA: Diagnosis not present

## 2021-01-08 DIAGNOSIS — Q688 Other specified congenital musculoskeletal deformities: Secondary | ICD-10-CM | POA: Diagnosis not present

## 2021-01-08 DIAGNOSIS — R279 Unspecified lack of coordination: Secondary | ICD-10-CM | POA: Diagnosis not present

## 2021-01-09 DIAGNOSIS — Q688 Other specified congenital musculoskeletal deformities: Secondary | ICD-10-CM | POA: Diagnosis not present

## 2021-01-10 ENCOUNTER — Other Ambulatory Visit (HOSPITAL_COMMUNITY): Payer: Self-pay

## 2021-01-10 DIAGNOSIS — Q688 Other specified congenital musculoskeletal deformities: Secondary | ICD-10-CM | POA: Diagnosis not present

## 2021-01-10 MED FILL — Montelukast Sodium Tab 10 MG (Base Equiv): ORAL | 30 days supply | Qty: 30 | Fill #5 | Status: CN

## 2021-01-11 ENCOUNTER — Other Ambulatory Visit (HOSPITAL_COMMUNITY): Payer: Self-pay

## 2021-01-11 DIAGNOSIS — Q688 Other specified congenital musculoskeletal deformities: Secondary | ICD-10-CM | POA: Diagnosis not present

## 2021-01-12 ENCOUNTER — Other Ambulatory Visit (HOSPITAL_COMMUNITY): Payer: Self-pay

## 2021-01-12 DIAGNOSIS — Q688 Other specified congenital musculoskeletal deformities: Secondary | ICD-10-CM | POA: Diagnosis not present

## 2021-01-14 DIAGNOSIS — Q688 Other specified congenital musculoskeletal deformities: Secondary | ICD-10-CM | POA: Diagnosis not present

## 2021-01-15 ENCOUNTER — Other Ambulatory Visit (HOSPITAL_COMMUNITY): Payer: Self-pay

## 2021-01-15 DIAGNOSIS — Q688 Other specified congenital musculoskeletal deformities: Secondary | ICD-10-CM | POA: Diagnosis not present

## 2021-01-15 DIAGNOSIS — R279 Unspecified lack of coordination: Secondary | ICD-10-CM | POA: Diagnosis not present

## 2021-01-15 MED FILL — Montelukast Sodium Tab 10 MG (Base Equiv): ORAL | 30 days supply | Qty: 30 | Fill #5 | Status: AC

## 2021-01-16 ENCOUNTER — Other Ambulatory Visit (HOSPITAL_COMMUNITY): Payer: Self-pay

## 2021-01-16 DIAGNOSIS — Q688 Other specified congenital musculoskeletal deformities: Secondary | ICD-10-CM | POA: Diagnosis not present

## 2021-01-16 MED ORDER — KARBINAL ER 4 MG/5ML PO SUER
4.0000 mg | Freq: Two times a day (BID) | ORAL | 5 refills | Status: DC
  Filled 2021-01-16: qty 300, 30d supply, fill #0
  Filled 2021-02-12: qty 300, 30d supply, fill #1
  Filled 2021-03-10: qty 300, 30d supply, fill #2
  Filled 2021-04-10: qty 300, 30d supply, fill #3
  Filled 2021-05-11: qty 300, 30d supply, fill #4
  Filled 2021-06-07: qty 300, 30d supply, fill #5

## 2021-01-17 DIAGNOSIS — Q688 Other specified congenital musculoskeletal deformities: Secondary | ICD-10-CM | POA: Diagnosis not present

## 2021-01-17 DIAGNOSIS — Z931 Gastrostomy status: Secondary | ICD-10-CM | POA: Diagnosis not present

## 2021-01-17 DIAGNOSIS — J9611 Chronic respiratory failure with hypoxia: Secondary | ICD-10-CM | POA: Diagnosis not present

## 2021-01-17 DIAGNOSIS — Z930 Tracheostomy status: Secondary | ICD-10-CM | POA: Diagnosis not present

## 2021-01-18 DIAGNOSIS — Z9911 Dependence on respirator [ventilator] status: Secondary | ICD-10-CM | POA: Diagnosis not present

## 2021-01-18 DIAGNOSIS — Z930 Tracheostomy status: Secondary | ICD-10-CM | POA: Diagnosis not present

## 2021-01-18 DIAGNOSIS — Z7951 Long term (current) use of inhaled steroids: Secondary | ICD-10-CM | POA: Diagnosis not present

## 2021-01-18 DIAGNOSIS — H6983 Other specified disorders of Eustachian tube, bilateral: Secondary | ICD-10-CM | POA: Diagnosis not present

## 2021-01-18 DIAGNOSIS — Q688 Other specified congenital musculoskeletal deformities: Secondary | ICD-10-CM | POA: Diagnosis not present

## 2021-01-18 DIAGNOSIS — J4540 Moderate persistent asthma, uncomplicated: Secondary | ICD-10-CM | POA: Diagnosis not present

## 2021-01-18 DIAGNOSIS — M4145 Neuromuscular scoliosis, thoracolumbar region: Secondary | ICD-10-CM | POA: Diagnosis not present

## 2021-01-18 DIAGNOSIS — G7129 Other congenital myopathy: Secondary | ICD-10-CM | POA: Diagnosis not present

## 2021-01-18 DIAGNOSIS — Z9622 Myringotomy tube(s) status: Secondary | ICD-10-CM | POA: Diagnosis not present

## 2021-01-18 DIAGNOSIS — H93293 Other abnormal auditory perceptions, bilateral: Secondary | ICD-10-CM | POA: Diagnosis not present

## 2021-01-18 DIAGNOSIS — J984 Other disorders of lung: Secondary | ICD-10-CM | POA: Diagnosis not present

## 2021-01-19 ENCOUNTER — Other Ambulatory Visit (HOSPITAL_COMMUNITY): Payer: Self-pay

## 2021-01-19 DIAGNOSIS — Z931 Gastrostomy status: Secondary | ICD-10-CM | POA: Diagnosis not present

## 2021-01-19 DIAGNOSIS — Q688 Other specified congenital musculoskeletal deformities: Secondary | ICD-10-CM | POA: Diagnosis not present

## 2021-01-19 DIAGNOSIS — Z930 Tracheostomy status: Secondary | ICD-10-CM | POA: Diagnosis not present

## 2021-01-19 DIAGNOSIS — J9611 Chronic respiratory failure with hypoxia: Secondary | ICD-10-CM | POA: Diagnosis not present

## 2021-01-19 MED ORDER — SODIUM CHLORIDE 3 % IN NEBU
4.0000 mL | INHALATION_SOLUTION | Freq: Four times a day (QID) | RESPIRATORY_TRACT | 11 refills | Status: AC | PRN
  Filled 2021-01-19: qty 240, 15d supply, fill #0
  Filled 2021-01-19: qty 750, 15d supply, fill #0

## 2021-01-20 DIAGNOSIS — Q688 Other specified congenital musculoskeletal deformities: Secondary | ICD-10-CM | POA: Diagnosis not present

## 2021-01-21 DIAGNOSIS — J9611 Chronic respiratory failure with hypoxia: Secondary | ICD-10-CM | POA: Diagnosis not present

## 2021-01-21 DIAGNOSIS — Z930 Tracheostomy status: Secondary | ICD-10-CM | POA: Diagnosis not present

## 2021-01-21 DIAGNOSIS — Z931 Gastrostomy status: Secondary | ICD-10-CM | POA: Diagnosis not present

## 2021-01-21 DIAGNOSIS — Q688 Other specified congenital musculoskeletal deformities: Secondary | ICD-10-CM | POA: Diagnosis not present

## 2021-01-22 DIAGNOSIS — Q688 Other specified congenital musculoskeletal deformities: Secondary | ICD-10-CM | POA: Diagnosis not present

## 2021-01-22 DIAGNOSIS — R279 Unspecified lack of coordination: Secondary | ICD-10-CM | POA: Diagnosis not present

## 2021-01-23 DIAGNOSIS — Q688 Other specified congenital musculoskeletal deformities: Secondary | ICD-10-CM | POA: Diagnosis not present

## 2021-01-24 DIAGNOSIS — Q688 Other specified congenital musculoskeletal deformities: Secondary | ICD-10-CM | POA: Diagnosis not present

## 2021-01-25 DIAGNOSIS — Q688 Other specified congenital musculoskeletal deformities: Secondary | ICD-10-CM | POA: Diagnosis not present

## 2021-01-26 ENCOUNTER — Other Ambulatory Visit (HOSPITAL_COMMUNITY): Payer: Self-pay

## 2021-01-26 DIAGNOSIS — Q688 Other specified congenital musculoskeletal deformities: Secondary | ICD-10-CM | POA: Diagnosis not present

## 2021-01-27 DIAGNOSIS — Q688 Other specified congenital musculoskeletal deformities: Secondary | ICD-10-CM | POA: Diagnosis not present

## 2021-01-28 DIAGNOSIS — Q688 Other specified congenital musculoskeletal deformities: Secondary | ICD-10-CM | POA: Diagnosis not present

## 2021-01-28 DIAGNOSIS — J9611 Chronic respiratory failure with hypoxia: Secondary | ICD-10-CM | POA: Diagnosis not present

## 2021-01-28 DIAGNOSIS — Z930 Tracheostomy status: Secondary | ICD-10-CM | POA: Diagnosis not present

## 2021-01-28 DIAGNOSIS — Z931 Gastrostomy status: Secondary | ICD-10-CM | POA: Diagnosis not present

## 2021-01-29 DIAGNOSIS — Q688 Other specified congenital musculoskeletal deformities: Secondary | ICD-10-CM | POA: Diagnosis not present

## 2021-01-29 DIAGNOSIS — R279 Unspecified lack of coordination: Secondary | ICD-10-CM | POA: Diagnosis not present

## 2021-01-30 DIAGNOSIS — Z931 Gastrostomy status: Secondary | ICD-10-CM | POA: Diagnosis not present

## 2021-01-30 DIAGNOSIS — J9611 Chronic respiratory failure with hypoxia: Secondary | ICD-10-CM | POA: Diagnosis not present

## 2021-01-30 DIAGNOSIS — Q688 Other specified congenital musculoskeletal deformities: Secondary | ICD-10-CM | POA: Diagnosis not present

## 2021-01-30 DIAGNOSIS — Z930 Tracheostomy status: Secondary | ICD-10-CM | POA: Diagnosis not present

## 2021-01-31 DIAGNOSIS — Q688 Other specified congenital musculoskeletal deformities: Secondary | ICD-10-CM | POA: Diagnosis not present

## 2021-02-01 DIAGNOSIS — Q688 Other specified congenital musculoskeletal deformities: Secondary | ICD-10-CM | POA: Diagnosis not present

## 2021-02-02 DIAGNOSIS — Q688 Other specified congenital musculoskeletal deformities: Secondary | ICD-10-CM | POA: Diagnosis not present

## 2021-02-04 DIAGNOSIS — Q688 Other specified congenital musculoskeletal deformities: Secondary | ICD-10-CM | POA: Diagnosis not present

## 2021-02-05 DIAGNOSIS — R279 Unspecified lack of coordination: Secondary | ICD-10-CM | POA: Diagnosis not present

## 2021-02-05 DIAGNOSIS — Q688 Other specified congenital musculoskeletal deformities: Secondary | ICD-10-CM | POA: Diagnosis not present

## 2021-02-06 ENCOUNTER — Other Ambulatory Visit (HOSPITAL_COMMUNITY): Payer: Self-pay

## 2021-02-06 DIAGNOSIS — Q688 Other specified congenital musculoskeletal deformities: Secondary | ICD-10-CM | POA: Diagnosis not present

## 2021-02-07 ENCOUNTER — Other Ambulatory Visit (HOSPITAL_COMMUNITY): Payer: Self-pay

## 2021-02-07 DIAGNOSIS — Q688 Other specified congenital musculoskeletal deformities: Secondary | ICD-10-CM | POA: Diagnosis not present

## 2021-02-08 ENCOUNTER — Other Ambulatory Visit (HOSPITAL_COMMUNITY): Payer: Self-pay

## 2021-02-08 DIAGNOSIS — Q688 Other specified congenital musculoskeletal deformities: Secondary | ICD-10-CM | POA: Diagnosis not present

## 2021-02-09 ENCOUNTER — Other Ambulatory Visit (HOSPITAL_COMMUNITY): Payer: Self-pay

## 2021-02-09 DIAGNOSIS — Q688 Other specified congenital musculoskeletal deformities: Secondary | ICD-10-CM | POA: Diagnosis not present

## 2021-02-09 MED ORDER — FAMOTIDINE 20 MG PO TABS
20.0000 mg | ORAL_TABLET | Freq: Every day | ORAL | 5 refills | Status: AC
  Filled 2021-02-09: qty 30, 30d supply, fill #0
  Filled 2021-03-19: qty 30, 30d supply, fill #1
  Filled 2021-04-12: qty 30, 30d supply, fill #2
  Filled 2021-06-25: qty 30, 30d supply, fill #3
  Filled 2021-07-24: qty 30, 30d supply, fill #4
  Filled 2021-08-24: qty 30, 30d supply, fill #5

## 2021-02-10 DIAGNOSIS — Q688 Other specified congenital musculoskeletal deformities: Secondary | ICD-10-CM | POA: Diagnosis not present

## 2021-02-12 ENCOUNTER — Other Ambulatory Visit (HOSPITAL_COMMUNITY): Payer: Self-pay

## 2021-02-12 DIAGNOSIS — Q688 Other specified congenital musculoskeletal deformities: Secondary | ICD-10-CM | POA: Diagnosis not present

## 2021-02-12 MED FILL — Montelukast Sodium Tab 10 MG (Base Equiv): ORAL | 30 days supply | Qty: 30 | Fill #6 | Status: CN

## 2021-02-13 ENCOUNTER — Other Ambulatory Visit (HOSPITAL_COMMUNITY): Payer: Self-pay

## 2021-02-13 DIAGNOSIS — Q688 Other specified congenital musculoskeletal deformities: Secondary | ICD-10-CM | POA: Diagnosis not present

## 2021-02-13 MED FILL — Montelukast Sodium Tab 10 MG (Base Equiv): ORAL | 30 days supply | Qty: 30 | Fill #0 | Status: AC

## 2021-02-14 ENCOUNTER — Other Ambulatory Visit (HOSPITAL_COMMUNITY): Payer: Self-pay

## 2021-02-14 DIAGNOSIS — Q688 Other specified congenital musculoskeletal deformities: Secondary | ICD-10-CM | POA: Diagnosis not present

## 2021-02-14 MED FILL — Triamcinolone Acetonide Cream 0.1%: CUTANEOUS | 10 days supply | Qty: 30 | Fill #0 | Status: AC

## 2021-02-15 DIAGNOSIS — J9611 Chronic respiratory failure with hypoxia: Secondary | ICD-10-CM | POA: Diagnosis not present

## 2021-02-15 DIAGNOSIS — Z931 Gastrostomy status: Secondary | ICD-10-CM | POA: Diagnosis not present

## 2021-02-15 DIAGNOSIS — Z930 Tracheostomy status: Secondary | ICD-10-CM | POA: Diagnosis not present

## 2021-02-15 DIAGNOSIS — Q688 Other specified congenital musculoskeletal deformities: Secondary | ICD-10-CM | POA: Diagnosis not present

## 2021-02-16 DIAGNOSIS — Z930 Tracheostomy status: Secondary | ICD-10-CM | POA: Diagnosis not present

## 2021-02-16 DIAGNOSIS — J9611 Chronic respiratory failure with hypoxia: Secondary | ICD-10-CM | POA: Diagnosis not present

## 2021-02-16 DIAGNOSIS — Q688 Other specified congenital musculoskeletal deformities: Secondary | ICD-10-CM | POA: Diagnosis not present

## 2021-02-16 DIAGNOSIS — Z931 Gastrostomy status: Secondary | ICD-10-CM | POA: Diagnosis not present

## 2021-02-17 DIAGNOSIS — Q688 Other specified congenital musculoskeletal deformities: Secondary | ICD-10-CM | POA: Diagnosis not present

## 2021-02-18 DIAGNOSIS — Q688 Other specified congenital musculoskeletal deformities: Secondary | ICD-10-CM | POA: Diagnosis not present

## 2021-02-19 DIAGNOSIS — R279 Unspecified lack of coordination: Secondary | ICD-10-CM | POA: Diagnosis not present

## 2021-02-19 DIAGNOSIS — Q688 Other specified congenital musculoskeletal deformities: Secondary | ICD-10-CM | POA: Diagnosis not present

## 2021-02-20 DIAGNOSIS — Q688 Other specified congenital musculoskeletal deformities: Secondary | ICD-10-CM | POA: Diagnosis not present

## 2021-02-20 DIAGNOSIS — Z931 Gastrostomy status: Secondary | ICD-10-CM | POA: Diagnosis not present

## 2021-02-20 DIAGNOSIS — J9611 Chronic respiratory failure with hypoxia: Secondary | ICD-10-CM | POA: Diagnosis not present

## 2021-02-20 DIAGNOSIS — Z930 Tracheostomy status: Secondary | ICD-10-CM | POA: Diagnosis not present

## 2021-02-21 DIAGNOSIS — Q688 Other specified congenital musculoskeletal deformities: Secondary | ICD-10-CM | POA: Diagnosis not present

## 2021-02-22 DIAGNOSIS — Q688 Other specified congenital musculoskeletal deformities: Secondary | ICD-10-CM | POA: Diagnosis not present

## 2021-02-23 DIAGNOSIS — Q688 Other specified congenital musculoskeletal deformities: Secondary | ICD-10-CM | POA: Diagnosis not present

## 2021-02-24 DIAGNOSIS — Q688 Other specified congenital musculoskeletal deformities: Secondary | ICD-10-CM | POA: Diagnosis not present

## 2021-02-25 DIAGNOSIS — Q688 Other specified congenital musculoskeletal deformities: Secondary | ICD-10-CM | POA: Diagnosis not present

## 2021-02-26 DIAGNOSIS — Q688 Other specified congenital musculoskeletal deformities: Secondary | ICD-10-CM | POA: Diagnosis not present

## 2021-02-26 DIAGNOSIS — R279 Unspecified lack of coordination: Secondary | ICD-10-CM | POA: Diagnosis not present

## 2021-02-27 DIAGNOSIS — Q688 Other specified congenital musculoskeletal deformities: Secondary | ICD-10-CM | POA: Diagnosis not present

## 2021-02-28 ENCOUNTER — Other Ambulatory Visit (HOSPITAL_COMMUNITY): Payer: Self-pay

## 2021-02-28 DIAGNOSIS — Q688 Other specified congenital musculoskeletal deformities: Secondary | ICD-10-CM | POA: Diagnosis not present

## 2021-03-01 ENCOUNTER — Other Ambulatory Visit (HOSPITAL_COMMUNITY): Payer: Self-pay

## 2021-03-01 DIAGNOSIS — Q688 Other specified congenital musculoskeletal deformities: Secondary | ICD-10-CM | POA: Diagnosis not present

## 2021-03-02 DIAGNOSIS — Q688 Other specified congenital musculoskeletal deformities: Secondary | ICD-10-CM | POA: Diagnosis not present

## 2021-03-04 DIAGNOSIS — Q688 Other specified congenital musculoskeletal deformities: Secondary | ICD-10-CM | POA: Diagnosis not present

## 2021-03-05 ENCOUNTER — Other Ambulatory Visit (HOSPITAL_COMMUNITY): Payer: Self-pay

## 2021-03-05 DIAGNOSIS — Q688 Other specified congenital musculoskeletal deformities: Secondary | ICD-10-CM | POA: Diagnosis not present

## 2021-03-05 DIAGNOSIS — L24B1 Irritant contact dermatitis related to digestive stoma or fistula: Secondary | ICD-10-CM | POA: Diagnosis not present

## 2021-03-05 DIAGNOSIS — R070 Pain in throat: Secondary | ICD-10-CM | POA: Diagnosis not present

## 2021-03-05 DIAGNOSIS — J4541 Moderate persistent asthma with (acute) exacerbation: Secondary | ICD-10-CM | POA: Diagnosis not present

## 2021-03-05 DIAGNOSIS — R051 Acute cough: Secondary | ICD-10-CM | POA: Diagnosis not present

## 2021-03-05 MED ORDER — CALMOSEPTINE 0.44-20.6 % EX OINT: TOPICAL_OINTMENT | CUTANEOUS | 2 refills | Status: AC

## 2021-03-05 MED ORDER — PREDNISOLONE SODIUM PHOSPHATE 15 MG/5ML PO SOLN
20.0000 mL | Freq: Every day | ORAL | 0 refills | Status: DC
  Filled 2021-03-05: qty 100, 5d supply, fill #0

## 2021-03-06 DIAGNOSIS — Q688 Other specified congenital musculoskeletal deformities: Secondary | ICD-10-CM | POA: Diagnosis not present

## 2021-03-07 DIAGNOSIS — Q688 Other specified congenital musculoskeletal deformities: Secondary | ICD-10-CM | POA: Diagnosis not present

## 2021-03-08 DIAGNOSIS — Q688 Other specified congenital musculoskeletal deformities: Secondary | ICD-10-CM | POA: Diagnosis not present

## 2021-03-08 DIAGNOSIS — Z930 Tracheostomy status: Secondary | ICD-10-CM | POA: Diagnosis not present

## 2021-03-08 DIAGNOSIS — Z931 Gastrostomy status: Secondary | ICD-10-CM | POA: Diagnosis not present

## 2021-03-08 DIAGNOSIS — J9611 Chronic respiratory failure with hypoxia: Secondary | ICD-10-CM | POA: Diagnosis not present

## 2021-03-09 DIAGNOSIS — Q688 Other specified congenital musculoskeletal deformities: Secondary | ICD-10-CM | POA: Diagnosis not present

## 2021-03-10 DIAGNOSIS — Q688 Other specified congenital musculoskeletal deformities: Secondary | ICD-10-CM | POA: Diagnosis not present

## 2021-03-11 DIAGNOSIS — Q688 Other specified congenital musculoskeletal deformities: Secondary | ICD-10-CM | POA: Diagnosis not present

## 2021-03-12 ENCOUNTER — Other Ambulatory Visit (HOSPITAL_COMMUNITY): Payer: Self-pay

## 2021-03-12 DIAGNOSIS — R279 Unspecified lack of coordination: Secondary | ICD-10-CM | POA: Diagnosis not present

## 2021-03-12 DIAGNOSIS — Q688 Other specified congenital musculoskeletal deformities: Secondary | ICD-10-CM | POA: Diagnosis not present

## 2021-03-13 ENCOUNTER — Other Ambulatory Visit (HOSPITAL_COMMUNITY): Payer: Self-pay

## 2021-03-13 ENCOUNTER — Telehealth (HOSPITAL_COMMUNITY): Payer: Self-pay

## 2021-03-13 DIAGNOSIS — Q688 Other specified congenital musculoskeletal deformities: Secondary | ICD-10-CM | POA: Diagnosis not present

## 2021-03-13 NOTE — Telephone Encounter (Signed)
Received a call from pt's mother for a peg eval. Called Dr. Vedia Pereyra office to have them fax over an order. AW

## 2021-03-14 ENCOUNTER — Other Ambulatory Visit (HOSPITAL_COMMUNITY): Payer: Self-pay | Admitting: Pediatrics

## 2021-03-14 ENCOUNTER — Other Ambulatory Visit (HOSPITAL_COMMUNITY): Payer: Self-pay

## 2021-03-14 DIAGNOSIS — Q688 Other specified congenital musculoskeletal deformities: Secondary | ICD-10-CM | POA: Diagnosis not present

## 2021-03-14 DIAGNOSIS — Z431 Encounter for attention to gastrostomy: Secondary | ICD-10-CM

## 2021-03-15 DIAGNOSIS — Q688 Other specified congenital musculoskeletal deformities: Secondary | ICD-10-CM | POA: Diagnosis not present

## 2021-03-16 ENCOUNTER — Ambulatory Visit (HOSPITAL_COMMUNITY): Admission: RE | Admit: 2021-03-16 | Payer: 59 | Source: Ambulatory Visit

## 2021-03-16 DIAGNOSIS — Q688 Other specified congenital musculoskeletal deformities: Secondary | ICD-10-CM | POA: Diagnosis not present

## 2021-03-17 DIAGNOSIS — Q688 Other specified congenital musculoskeletal deformities: Secondary | ICD-10-CM | POA: Diagnosis not present

## 2021-03-18 DIAGNOSIS — Q688 Other specified congenital musculoskeletal deformities: Secondary | ICD-10-CM | POA: Diagnosis not present

## 2021-03-19 ENCOUNTER — Other Ambulatory Visit (HOSPITAL_COMMUNITY): Payer: Self-pay

## 2021-03-19 DIAGNOSIS — Z930 Tracheostomy status: Secondary | ICD-10-CM | POA: Diagnosis not present

## 2021-03-19 DIAGNOSIS — R279 Unspecified lack of coordination: Secondary | ICD-10-CM | POA: Diagnosis not present

## 2021-03-19 DIAGNOSIS — J9611 Chronic respiratory failure with hypoxia: Secondary | ICD-10-CM | POA: Diagnosis not present

## 2021-03-19 DIAGNOSIS — Z931 Gastrostomy status: Secondary | ICD-10-CM | POA: Diagnosis not present

## 2021-03-19 DIAGNOSIS — Q688 Other specified congenital musculoskeletal deformities: Secondary | ICD-10-CM | POA: Diagnosis not present

## 2021-03-20 ENCOUNTER — Other Ambulatory Visit: Payer: Self-pay | Admitting: Radiology

## 2021-03-20 DIAGNOSIS — Q688 Other specified congenital musculoskeletal deformities: Secondary | ICD-10-CM | POA: Diagnosis not present

## 2021-03-21 ENCOUNTER — Ambulatory Visit (HOSPITAL_COMMUNITY)
Admission: RE | Admit: 2021-03-21 | Discharge: 2021-03-21 | Disposition: A | Discharge status: Home / Self Care | Payer: 59 | Source: Ambulatory Visit | Attending: Pediatrics | Admitting: Pediatrics

## 2021-03-21 ENCOUNTER — Other Ambulatory Visit: Payer: Self-pay

## 2021-03-21 DIAGNOSIS — K9423 Gastrostomy malfunction: Secondary | ICD-10-CM | POA: Diagnosis not present

## 2021-03-21 DIAGNOSIS — Z431 Encounter for attention to gastrostomy: Secondary | ICD-10-CM | POA: Diagnosis not present

## 2021-03-21 DIAGNOSIS — Q688 Other specified congenital musculoskeletal deformities: Secondary | ICD-10-CM | POA: Diagnosis not present

## 2021-03-21 HISTORY — PX: IR REPLC GASTRO/COLONIC TUBE PERCUT W/FLUORO: IMG2333

## 2021-03-21 MED ORDER — LIDOCAINE VISCOUS HCL 2 % MT SOLN
OROMUCOSAL | Status: AC
  Filled 2021-03-21: qty 15

## 2021-03-21 MED ORDER — IOHEXOL 300 MG/ML  SOLN
100.0000 mL | Freq: Once | INTRAMUSCULAR | Status: AC | PRN
  Administered 2021-03-21: 10 mL

## 2021-03-21 NOTE — Procedures (Signed)
Interventional Radiology Procedure Note  History: 20 yo female with long standing gastrostomy, previously treated in-office with Dr. Leeanne Mannan.    She is presenting for her first exchange with [facility].   The current is low-profile Mic-Key, 1.7cm stoma length, 85F.  Report from her mother and the patient is that the stoma length may need to be increased, as she has had some weight gain, and there is some leakage at the stoma.    Procedure:   Exchange of gastrostomy.  Stoma length measured, new length = 2.5cm.    Our dept does not have a proper-sized gastrostomy.  We will order an 85F, 2.5cm stoma length Mic-Key button. We will schedule her for future exchange once in stock.   Today we modified an 85F, 2.5cm stoma length G-J, by amputating the j limb.   Complications: None Recommendations:  - Ok to use - future exchange  Signed,  Yvone Neu. Loreta Ave, DO

## 2021-03-22 DIAGNOSIS — Q688 Other specified congenital musculoskeletal deformities: Secondary | ICD-10-CM | POA: Diagnosis not present

## 2021-03-23 ENCOUNTER — Other Ambulatory Visit (HOSPITAL_COMMUNITY): Payer: Self-pay

## 2021-03-23 DIAGNOSIS — Z930 Tracheostomy status: Secondary | ICD-10-CM | POA: Diagnosis not present

## 2021-03-23 DIAGNOSIS — Z931 Gastrostomy status: Secondary | ICD-10-CM | POA: Diagnosis not present

## 2021-03-23 DIAGNOSIS — J9611 Chronic respiratory failure with hypoxia: Secondary | ICD-10-CM | POA: Diagnosis not present

## 2021-03-23 DIAGNOSIS — Q688 Other specified congenital musculoskeletal deformities: Secondary | ICD-10-CM | POA: Diagnosis not present

## 2021-03-25 DIAGNOSIS — Q688 Other specified congenital musculoskeletal deformities: Secondary | ICD-10-CM | POA: Diagnosis not present

## 2021-03-25 MED FILL — Montelukast Sodium Tab 10 MG (Base Equiv): ORAL | 30 days supply | Qty: 30 | Fill #1 | Status: AC

## 2021-03-26 ENCOUNTER — Other Ambulatory Visit (HOSPITAL_COMMUNITY): Payer: Self-pay

## 2021-03-26 DIAGNOSIS — Q688 Other specified congenital musculoskeletal deformities: Secondary | ICD-10-CM | POA: Diagnosis not present

## 2021-03-26 DIAGNOSIS — R279 Unspecified lack of coordination: Secondary | ICD-10-CM | POA: Diagnosis not present

## 2021-03-27 DIAGNOSIS — Q688 Other specified congenital musculoskeletal deformities: Secondary | ICD-10-CM | POA: Diagnosis not present

## 2021-03-28 DIAGNOSIS — Q688 Other specified congenital musculoskeletal deformities: Secondary | ICD-10-CM | POA: Diagnosis not present

## 2021-03-29 DIAGNOSIS — Q688 Other specified congenital musculoskeletal deformities: Secondary | ICD-10-CM | POA: Diagnosis not present

## 2021-03-30 DIAGNOSIS — Q688 Other specified congenital musculoskeletal deformities: Secondary | ICD-10-CM | POA: Diagnosis not present

## 2021-03-31 DIAGNOSIS — Q688 Other specified congenital musculoskeletal deformities: Secondary | ICD-10-CM | POA: Diagnosis not present

## 2021-04-01 DIAGNOSIS — Q688 Other specified congenital musculoskeletal deformities: Secondary | ICD-10-CM | POA: Diagnosis not present

## 2021-04-02 DIAGNOSIS — Q688 Other specified congenital musculoskeletal deformities: Secondary | ICD-10-CM | POA: Diagnosis not present

## 2021-04-03 DIAGNOSIS — R279 Unspecified lack of coordination: Secondary | ICD-10-CM | POA: Diagnosis not present

## 2021-04-03 DIAGNOSIS — Q688 Other specified congenital musculoskeletal deformities: Secondary | ICD-10-CM | POA: Diagnosis not present

## 2021-04-04 DIAGNOSIS — Q688 Other specified congenital musculoskeletal deformities: Secondary | ICD-10-CM | POA: Diagnosis not present

## 2021-04-05 DIAGNOSIS — Q688 Other specified congenital musculoskeletal deformities: Secondary | ICD-10-CM | POA: Diagnosis not present

## 2021-04-06 DIAGNOSIS — Z930 Tracheostomy status: Secondary | ICD-10-CM | POA: Diagnosis not present

## 2021-04-06 DIAGNOSIS — Z931 Gastrostomy status: Secondary | ICD-10-CM | POA: Diagnosis not present

## 2021-04-06 DIAGNOSIS — Q688 Other specified congenital musculoskeletal deformities: Secondary | ICD-10-CM | POA: Diagnosis not present

## 2021-04-06 DIAGNOSIS — J9611 Chronic respiratory failure with hypoxia: Secondary | ICD-10-CM | POA: Diagnosis not present

## 2021-04-06 DIAGNOSIS — G7129 Other congenital myopathy: Secondary | ICD-10-CM | POA: Diagnosis not present

## 2021-04-08 DIAGNOSIS — Q688 Other specified congenital musculoskeletal deformities: Secondary | ICD-10-CM | POA: Diagnosis not present

## 2021-04-09 DIAGNOSIS — R279 Unspecified lack of coordination: Secondary | ICD-10-CM | POA: Diagnosis not present

## 2021-04-09 DIAGNOSIS — Q688 Other specified congenital musculoskeletal deformities: Secondary | ICD-10-CM | POA: Diagnosis not present

## 2021-04-10 ENCOUNTER — Other Ambulatory Visit (HOSPITAL_COMMUNITY): Payer: Self-pay

## 2021-04-10 DIAGNOSIS — Q688 Other specified congenital musculoskeletal deformities: Secondary | ICD-10-CM | POA: Diagnosis not present

## 2021-04-11 DIAGNOSIS — Q688 Other specified congenital musculoskeletal deformities: Secondary | ICD-10-CM | POA: Diagnosis not present

## 2021-04-12 ENCOUNTER — Other Ambulatory Visit (HOSPITAL_COMMUNITY): Payer: Self-pay

## 2021-04-12 DIAGNOSIS — Q688 Other specified congenital musculoskeletal deformities: Secondary | ICD-10-CM | POA: Diagnosis not present

## 2021-04-13 DIAGNOSIS — Q688 Other specified congenital musculoskeletal deformities: Secondary | ICD-10-CM | POA: Diagnosis not present

## 2021-04-14 DIAGNOSIS — Q688 Other specified congenital musculoskeletal deformities: Secondary | ICD-10-CM | POA: Diagnosis not present

## 2021-04-15 DIAGNOSIS — Q688 Other specified congenital musculoskeletal deformities: Secondary | ICD-10-CM | POA: Diagnosis not present

## 2021-04-16 DIAGNOSIS — Q688 Other specified congenital musculoskeletal deformities: Secondary | ICD-10-CM | POA: Diagnosis not present

## 2021-04-16 DIAGNOSIS — R279 Unspecified lack of coordination: Secondary | ICD-10-CM | POA: Diagnosis not present

## 2021-04-17 ENCOUNTER — Other Ambulatory Visit (HOSPITAL_COMMUNITY): Payer: Self-pay

## 2021-04-17 DIAGNOSIS — Q688 Other specified congenital musculoskeletal deformities: Secondary | ICD-10-CM | POA: Diagnosis not present

## 2021-04-17 MED ORDER — EPINEPHRINE 0.3 MG/0.3ML IJ SOAJ
INTRAMUSCULAR | 1 refills | Status: DC
  Filled 2021-04-17: qty 2, 14d supply, fill #0
  Filled 2021-10-12: qty 2, 14d supply, fill #1

## 2021-04-18 DIAGNOSIS — Q688 Other specified congenital musculoskeletal deformities: Secondary | ICD-10-CM | POA: Diagnosis not present

## 2021-04-18 DIAGNOSIS — J9611 Chronic respiratory failure with hypoxia: Secondary | ICD-10-CM | POA: Diagnosis not present

## 2021-04-18 DIAGNOSIS — Z930 Tracheostomy status: Secondary | ICD-10-CM | POA: Diagnosis not present

## 2021-04-18 DIAGNOSIS — Z931 Gastrostomy status: Secondary | ICD-10-CM | POA: Diagnosis not present

## 2021-04-19 DIAGNOSIS — Q688 Other specified congenital musculoskeletal deformities: Secondary | ICD-10-CM | POA: Diagnosis not present

## 2021-04-20 DIAGNOSIS — Q688 Other specified congenital musculoskeletal deformities: Secondary | ICD-10-CM | POA: Diagnosis not present

## 2021-04-22 DIAGNOSIS — Z930 Tracheostomy status: Secondary | ICD-10-CM | POA: Diagnosis not present

## 2021-04-22 DIAGNOSIS — Z931 Gastrostomy status: Secondary | ICD-10-CM | POA: Diagnosis not present

## 2021-04-22 DIAGNOSIS — J9611 Chronic respiratory failure with hypoxia: Secondary | ICD-10-CM | POA: Diagnosis not present

## 2021-04-23 DIAGNOSIS — Q688 Other specified congenital musculoskeletal deformities: Secondary | ICD-10-CM | POA: Diagnosis not present

## 2021-04-24 ENCOUNTER — Other Ambulatory Visit (HOSPITAL_COMMUNITY): Payer: Self-pay

## 2021-04-24 DIAGNOSIS — R279 Unspecified lack of coordination: Secondary | ICD-10-CM | POA: Diagnosis not present

## 2021-04-24 DIAGNOSIS — Q688 Other specified congenital musculoskeletal deformities: Secondary | ICD-10-CM | POA: Diagnosis not present

## 2021-04-24 MED FILL — Montelukast Sodium Tab 10 MG (Base Equiv): ORAL | 30 days supply | Qty: 30 | Fill #2 | Status: AC

## 2021-04-25 ENCOUNTER — Other Ambulatory Visit (HOSPITAL_COMMUNITY): Payer: Self-pay

## 2021-04-25 DIAGNOSIS — Q688 Other specified congenital musculoskeletal deformities: Secondary | ICD-10-CM | POA: Diagnosis not present

## 2021-04-26 ENCOUNTER — Other Ambulatory Visit (HOSPITAL_COMMUNITY): Payer: Self-pay

## 2021-04-26 DIAGNOSIS — Q688 Other specified congenital musculoskeletal deformities: Secondary | ICD-10-CM | POA: Diagnosis not present

## 2021-04-26 MED ORDER — PREDNISOLONE SODIUM PHOSPHATE 15 MG/5ML PO SOLN
60.0000 mg | Freq: Every day | ORAL | 0 refills | Status: AC
  Filled 2021-04-26: qty 100, 5d supply, fill #0

## 2021-04-26 MED ORDER — AMOXICILLIN-POT CLAVULANATE 250-62.5 MG/5ML PO SUSR
500.0000 mg | Freq: Three times a day (TID) | ORAL | 0 refills | Status: AC
  Filled 2021-04-26: qty 300, 10d supply, fill #0

## 2021-04-26 MED ORDER — AMOXICILLIN 400 MG/5ML PO SUSR
800.0000 mg | Freq: Two times a day (BID) | ORAL | 0 refills | Status: AC
  Filled 2021-04-26: qty 200, 10d supply, fill #0

## 2021-04-27 DIAGNOSIS — Q688 Other specified congenital musculoskeletal deformities: Secondary | ICD-10-CM | POA: Diagnosis not present

## 2021-05-11 ENCOUNTER — Other Ambulatory Visit (HOSPITAL_COMMUNITY): Payer: Self-pay

## 2021-05-11 MED ORDER — FLUTICASONE PROPIONATE 50 MCG/ACT NA SUSP
1.0000 | Freq: Every day | NASAL | 11 refills | Status: AC
  Filled 2021-05-11: qty 16, 60d supply, fill #0
  Filled 2021-06-25: qty 16, 60d supply, fill #1
  Filled 2021-07-27: qty 16, 60d supply, fill #2
  Filled 2021-09-17: qty 16, 60d supply, fill #3
  Filled 2021-11-06: qty 16, 60d supply, fill #4
  Filled 2022-03-18: qty 16, 60d supply, fill #5

## 2021-05-11 MED ORDER — IPRATROPIUM BROMIDE 0.03 % NA SOLN
2.0000 | Freq: Two times a day (BID) | NASAL | 11 refills | Status: AC | PRN
  Filled 2021-05-11: qty 30, 43d supply, fill #0

## 2021-05-11 MED ORDER — BUDESONIDE-FORMOTEROL FUMARATE 80-4.5 MCG/ACT IN AERO
INHALATION_SPRAY | RESPIRATORY_TRACT | 12 refills | Status: AC
  Filled 2021-05-11: qty 10.2, 10d supply, fill #0
  Filled 2021-05-11: qty 10.2, 15d supply, fill #0
  Filled 2021-06-21: qty 10.2, 15d supply, fill #1
  Filled 2021-08-24: qty 10.2, 15d supply, fill #2
  Filled 2021-09-17: qty 10.2, 15d supply, fill #3
  Filled 2021-10-12: qty 10.2, 15d supply, fill #4
  Filled 2022-02-19: qty 10.2, 15d supply, fill #5
  Filled 2022-03-18: qty 10.2, 15d supply, fill #6
  Filled 2022-04-17: qty 10.2, 15d supply, fill #7

## 2021-05-11 MED ORDER — CETIRIZINE HCL 10 MG PO TABS
10.0000 mg | ORAL_TABLET | Freq: Every day | ORAL | 11 refills | Status: AC | PRN
  Filled 2021-05-11: qty 30, 30d supply, fill #0

## 2021-05-11 MED ORDER — SODIUM CHLORIDE 3 % IN NEBU
4.0000 mL | INHALATION_SOLUTION | Freq: Four times a day (QID) | RESPIRATORY_TRACT | 11 refills | Status: DC | PRN
  Filled 2021-05-11: qty 750, 13d supply, fill #0
  Filled 2021-05-21: qty 750, 13d supply, fill #1
  Filled 2021-08-14: qty 750, 13d supply, fill #2
  Filled 2022-03-06: qty 750, 13d supply, fill #3

## 2021-05-11 MED ORDER — ALBUTEROL SULFATE (2.5 MG/3ML) 0.083% IN NEBU
3.0000 mL | INHALATION_SOLUTION | RESPIRATORY_TRACT | 8 refills | Status: AC | PRN
  Filled 2021-05-11 – 2021-05-21 (×2): qty 150, 9d supply, fill #0

## 2021-05-11 MED ORDER — MONTELUKAST SODIUM 10 MG PO TABS
10.0000 mg | ORAL_TABLET | Freq: Every day | ORAL | 12 refills | Status: AC
  Filled 2021-05-11 – 2021-06-25 (×2): qty 30, 30d supply, fill #0
  Filled 2021-07-24: qty 30, 30d supply, fill #1
  Filled 2021-08-24: qty 30, 30d supply, fill #2
  Filled 2021-09-17: qty 30, 30d supply, fill #3

## 2021-05-14 ENCOUNTER — Other Ambulatory Visit (HOSPITAL_COMMUNITY): Payer: Self-pay

## 2021-05-16 ENCOUNTER — Other Ambulatory Visit (HOSPITAL_COMMUNITY): Payer: Self-pay | Admitting: Interventional Radiology

## 2021-05-17 ENCOUNTER — Telehealth (HOSPITAL_COMMUNITY): Payer: Self-pay

## 2021-05-17 ENCOUNTER — Other Ambulatory Visit (HOSPITAL_COMMUNITY): Payer: Self-pay

## 2021-05-17 NOTE — Telephone Encounter (Signed)
Returned call, no answer, left vm. AW 

## 2021-05-21 ENCOUNTER — Other Ambulatory Visit (HOSPITAL_COMMUNITY): Payer: Self-pay

## 2021-05-22 ENCOUNTER — Telehealth (HOSPITAL_COMMUNITY): Payer: Self-pay

## 2021-05-22 NOTE — Telephone Encounter (Signed)
Returned call, no answer, left vm. AW 

## 2021-05-23 ENCOUNTER — Other Ambulatory Visit (HOSPITAL_COMMUNITY): Payer: Self-pay | Admitting: Interventional Radiology

## 2021-05-23 DIAGNOSIS — Z431 Encounter for attention to gastrostomy: Secondary | ICD-10-CM

## 2021-05-24 ENCOUNTER — Telehealth (HOSPITAL_COMMUNITY): Payer: Self-pay

## 2021-05-24 ENCOUNTER — Ambulatory Visit (HOSPITAL_COMMUNITY)
Admission: RE | Admit: 2021-05-24 | Discharge: 2021-05-24 | Disposition: A | Discharge status: Home / Self Care | Payer: Medicaid Other | Source: Ambulatory Visit | Attending: Interventional Radiology | Admitting: Interventional Radiology

## 2021-05-24 ENCOUNTER — Other Ambulatory Visit: Payer: Self-pay

## 2021-05-24 DIAGNOSIS — Z431 Encounter for attention to gastrostomy: Secondary | ICD-10-CM

## 2021-05-24 DIAGNOSIS — K9423 Gastrostomy malfunction: Secondary | ICD-10-CM | POA: Insufficient documentation

## 2021-05-24 HISTORY — PX: IR REPLC GASTRO/COLONIC TUBE PERCUT W/FLUORO: IMG2333

## 2021-05-24 MED ORDER — LIDOCAINE VISCOUS HCL 2 % MT SOLN
OROMUCOSAL | Status: AC
  Filled 2021-05-24: qty 15

## 2021-05-24 MED ORDER — LIDOCAINE VISCOUS HCL 2 % MT SOLN
OROMUCOSAL | Status: DC | PRN
  Administered 2021-05-24: 5 mL

## 2021-05-24 MED ORDER — IOHEXOL 300 MG/ML  SOLN
100.0000 mL | Freq: Once | INTRAMUSCULAR | Status: AC | PRN
  Administered 2021-05-24: 10 mL

## 2021-05-24 NOTE — Telephone Encounter (Signed)
Called to remind mother of appt today and let her know that we do have the tube in, no answer, left vm. AW

## 2021-05-24 NOTE — Progress Notes (Signed)
Successful exchange of new 18 French 2.5cm stomal length low-profile Mickey gastrostomy. No complications.  Brayton El PA-C Interventional Radiology 05/24/2021 2:53 PM

## 2021-05-28 MED FILL — Montelukast Sodium Tab 10 MG (Base Equiv): ORAL | 30 days supply | Qty: 30 | Fill #3 | Status: AC

## 2021-05-29 ENCOUNTER — Other Ambulatory Visit (HOSPITAL_COMMUNITY): Payer: Self-pay

## 2021-06-07 ENCOUNTER — Other Ambulatory Visit (HOSPITAL_COMMUNITY): Payer: Self-pay

## 2021-06-08 ENCOUNTER — Other Ambulatory Visit (HOSPITAL_COMMUNITY): Payer: Self-pay

## 2021-06-22 ENCOUNTER — Other Ambulatory Visit (HOSPITAL_COMMUNITY): Payer: Self-pay

## 2021-06-25 ENCOUNTER — Other Ambulatory Visit (HOSPITAL_COMMUNITY): Payer: Self-pay

## 2021-06-28 ENCOUNTER — Other Ambulatory Visit (HOSPITAL_COMMUNITY): Payer: Self-pay

## 2021-07-03 ENCOUNTER — Other Ambulatory Visit (HOSPITAL_COMMUNITY): Payer: Self-pay

## 2021-07-04 ENCOUNTER — Other Ambulatory Visit (HOSPITAL_COMMUNITY): Payer: Self-pay

## 2021-07-05 ENCOUNTER — Other Ambulatory Visit (HOSPITAL_COMMUNITY): Payer: Self-pay

## 2021-07-06 ENCOUNTER — Other Ambulatory Visit (HOSPITAL_COMMUNITY): Payer: Self-pay

## 2021-07-09 ENCOUNTER — Other Ambulatory Visit (HOSPITAL_COMMUNITY): Payer: Self-pay

## 2021-07-09 MED ORDER — CARBINOXAMINE MALEATE ER 4 MG/5ML PO SUER
4.0000 mg | Freq: Two times a day (BID) | ORAL | 5 refills | Status: DC
  Filled 2021-07-09: qty 300, 30d supply, fill #0
  Filled 2021-08-01 – 2021-08-02 (×2): qty 300, 30d supply, fill #1
  Filled 2021-08-24 – 2021-08-29 (×2): qty 300, 30d supply, fill #2
  Filled 2021-09-25: qty 300, 30d supply, fill #3
  Filled 2021-10-22: qty 300, 30d supply, fill #4
  Filled 2021-11-20: qty 300, 30d supply, fill #5

## 2021-07-24 ENCOUNTER — Other Ambulatory Visit (HOSPITAL_COMMUNITY): Payer: Self-pay

## 2021-07-25 ENCOUNTER — Other Ambulatory Visit (HOSPITAL_COMMUNITY): Payer: Self-pay

## 2021-07-25 MED ORDER — FAMOTIDINE 20 MG PO TABS
20.0000 mg | ORAL_TABLET | Freq: Every day | ORAL | 5 refills | Status: DC
  Filled 2021-07-25 – 2021-09-17 (×2): qty 30, 30d supply, fill #0
  Filled 2021-10-25: qty 30, 30d supply, fill #1
  Filled 2021-11-20: qty 30, 30d supply, fill #2
  Filled 2021-12-21: qty 30, 30d supply, fill #3
  Filled 2022-01-22: qty 30, 30d supply, fill #4
  Filled 2022-02-19: qty 30, 30d supply, fill #5

## 2021-07-27 ENCOUNTER — Other Ambulatory Visit (HOSPITAL_COMMUNITY): Payer: Self-pay

## 2021-08-01 ENCOUNTER — Other Ambulatory Visit (HOSPITAL_COMMUNITY): Payer: Self-pay

## 2021-08-02 ENCOUNTER — Other Ambulatory Visit (HOSPITAL_COMMUNITY): Payer: Self-pay

## 2021-08-14 ENCOUNTER — Other Ambulatory Visit (HOSPITAL_COMMUNITY): Payer: Self-pay

## 2021-08-24 ENCOUNTER — Other Ambulatory Visit (HOSPITAL_COMMUNITY): Payer: Self-pay

## 2021-08-29 ENCOUNTER — Other Ambulatory Visit (HOSPITAL_COMMUNITY): Payer: Self-pay

## 2021-09-17 ENCOUNTER — Other Ambulatory Visit (HOSPITAL_COMMUNITY): Payer: Self-pay

## 2021-09-25 ENCOUNTER — Other Ambulatory Visit (HOSPITAL_COMMUNITY): Payer: Self-pay

## 2021-10-12 ENCOUNTER — Other Ambulatory Visit (HOSPITAL_COMMUNITY): Payer: Self-pay

## 2021-10-15 ENCOUNTER — Other Ambulatory Visit (HOSPITAL_COMMUNITY): Payer: Self-pay

## 2021-10-15 MED ORDER — MONTELUKAST SODIUM 10 MG PO TABS
10.0000 mg | ORAL_TABLET | Freq: Every evening | ORAL | 11 refills | Status: AC
  Filled 2021-10-15: qty 30, 30d supply, fill #0

## 2021-10-22 ENCOUNTER — Other Ambulatory Visit (HOSPITAL_COMMUNITY): Payer: Self-pay

## 2021-10-25 ENCOUNTER — Other Ambulatory Visit (HOSPITAL_COMMUNITY): Payer: Self-pay

## 2021-11-07 ENCOUNTER — Other Ambulatory Visit (HOSPITAL_COMMUNITY): Payer: Self-pay

## 2021-11-09 ENCOUNTER — Other Ambulatory Visit (HOSPITAL_COMMUNITY): Payer: Self-pay

## 2021-11-09 MED ORDER — MONTELUKAST SODIUM 10 MG PO TABS
10.0000 mg | ORAL_TABLET | Freq: Every day | ORAL | 11 refills | Status: DC
  Filled 2021-11-09: qty 30, 30d supply, fill #0
  Filled 2021-12-21: qty 30, 30d supply, fill #1
  Filled 2022-01-22: qty 30, 30d supply, fill #2
  Filled 2022-02-19: qty 30, 30d supply, fill #3
  Filled 2022-03-18: qty 30, 30d supply, fill #4
  Filled 2022-04-30: qty 30, 30d supply, fill #5
  Filled 2022-05-27: qty 30, 30d supply, fill #6
  Filled 2022-06-17 – 2022-06-18 (×2): qty 30, 30d supply, fill #7
  Filled 2022-07-16: qty 30, 30d supply, fill #8
  Filled 2022-08-15: qty 30, 30d supply, fill #9
  Filled 2022-09-17: qty 30, 30d supply, fill #10
  Filled 2022-10-21: qty 30, 30d supply, fill #11

## 2021-11-09 MED ORDER — IPRATROPIUM BROMIDE 0.03 % NA SOLN
2.0000 | Freq: Two times a day (BID) | NASAL | 11 refills | Status: AC | PRN
  Filled 2021-11-09: qty 30, 43d supply, fill #0

## 2021-11-09 MED ORDER — ALBUTEROL SULFATE (2.5 MG/3ML) 0.083% IN NEBU
2.5000 mg | INHALATION_SOLUTION | RESPIRATORY_TRACT | 8 refills | Status: AC | PRN
  Filled 2021-11-09: qty 150, 9d supply, fill #0

## 2021-11-09 MED ORDER — ALBUTEROL SULFATE HFA 108 (90 BASE) MCG/ACT IN AERS
2.0000 | INHALATION_SPRAY | RESPIRATORY_TRACT | 3 refills | Status: AC | PRN
  Filled 2021-11-09: qty 18, 17d supply, fill #0

## 2021-11-09 MED ORDER — FLUTICASONE PROPIONATE 50 MCG/ACT NA SUSP
1.0000 | Freq: Every day | NASAL | 11 refills | Status: AC
  Filled 2021-11-09 – 2022-01-22 (×3): qty 16, 60d supply, fill #0
  Filled 2022-03-06 – 2022-05-21 (×2): qty 16, 60d supply, fill #1
  Filled 2022-07-16: qty 16, 60d supply, fill #2
  Filled 2022-09-17: qty 16, 60d supply, fill #3

## 2021-11-09 MED ORDER — ZYRTEC ALLERGY 10 MG PO CAPS
10.0000 mg | ORAL_CAPSULE | Freq: Every day | ORAL | 11 refills | Status: AC | PRN
Start: 2021-11-09 — End: ?

## 2021-11-09 MED ORDER — OLOPATADINE HCL 0.2 % OP SOLN
1.0000 [drp] | OPHTHALMIC | 3 refills | Status: AC | PRN
  Filled 2021-11-09: qty 2.5, 25d supply, fill #0

## 2021-11-09 MED ORDER — BUDESONIDE-FORMOTEROL FUMARATE 80-4.5 MCG/ACT IN AERO
2.0000 | INHALATION_SPRAY | Freq: Two times a day (BID) | RESPIRATORY_TRACT | 12 refills | Status: DC
  Filled 2021-11-09: qty 10.2, 15d supply, fill #0
  Filled 2021-12-21: qty 10.2, 15d supply, fill #1
  Filled 2022-01-22: qty 10.2, 15d supply, fill #2
  Filled 2022-05-22: qty 10.2, 15d supply, fill #3
  Filled 2022-06-17 – 2022-06-18 (×2): qty 10.2, 15d supply, fill #4
  Filled 2022-07-16: qty 10.2, 15d supply, fill #5
  Filled 2022-08-15: qty 10.2, 15d supply, fill #6
  Filled 2022-09-17: qty 10.2, 15d supply, fill #7
  Filled 2022-10-21: qty 10.2, 15d supply, fill #8

## 2021-11-20 ENCOUNTER — Other Ambulatory Visit (HOSPITAL_COMMUNITY): Payer: Self-pay

## 2021-12-20 ENCOUNTER — Other Ambulatory Visit (HOSPITAL_COMMUNITY): Payer: Self-pay

## 2021-12-21 ENCOUNTER — Other Ambulatory Visit (HOSPITAL_COMMUNITY): Payer: Self-pay

## 2021-12-21 MED ORDER — CARBINOXAMINE MALEATE ER 4 MG/5ML PO SUER
5.0000 mL | Freq: Two times a day (BID) | ORAL | 5 refills | Status: AC
  Filled 2021-12-21: qty 300, 30d supply, fill #0
  Filled 2022-01-22: qty 300, 30d supply, fill #1
  Filled 2022-02-19: qty 300, 30d supply, fill #2
  Filled 2022-03-18: qty 300, 30d supply, fill #3
  Filled 2022-04-17 – 2022-04-19 (×2): qty 300, 30d supply, fill #4
  Filled 2022-05-21: qty 300, 30d supply, fill #5

## 2021-12-24 ENCOUNTER — Other Ambulatory Visit (HOSPITAL_COMMUNITY): Payer: Self-pay

## 2022-01-17 ENCOUNTER — Other Ambulatory Visit (HOSPITAL_COMMUNITY): Payer: Self-pay

## 2022-01-21 ENCOUNTER — Other Ambulatory Visit (HOSPITAL_COMMUNITY): Payer: Self-pay

## 2022-01-22 ENCOUNTER — Other Ambulatory Visit (HOSPITAL_COMMUNITY): Payer: Self-pay

## 2022-02-19 ENCOUNTER — Other Ambulatory Visit (HOSPITAL_COMMUNITY): Payer: Self-pay

## 2022-03-06 ENCOUNTER — Other Ambulatory Visit (HOSPITAL_COMMUNITY): Payer: Self-pay

## 2022-03-07 ENCOUNTER — Other Ambulatory Visit (HOSPITAL_COMMUNITY): Payer: Self-pay

## 2022-03-18 ENCOUNTER — Other Ambulatory Visit (HOSPITAL_COMMUNITY): Payer: Self-pay

## 2022-03-25 ENCOUNTER — Other Ambulatory Visit (HOSPITAL_COMMUNITY): Payer: Self-pay

## 2022-03-25 MED ORDER — FAMOTIDINE 20 MG PO TABS
20.0000 mg | ORAL_TABLET | Freq: Every day | ORAL | 5 refills | Status: AC
  Filled 2022-03-25: qty 30, 30d supply, fill #0
  Filled 2022-04-28: qty 30, 30d supply, fill #1
  Filled 2022-05-27: qty 30, 30d supply, fill #2
  Filled 2022-06-17 – 2022-06-18 (×2): qty 30, 30d supply, fill #3
  Filled 2022-07-16: qty 30, 30d supply, fill #4
  Filled 2022-08-15: qty 30, 30d supply, fill #5

## 2022-03-26 ENCOUNTER — Other Ambulatory Visit (HOSPITAL_COMMUNITY): Payer: Self-pay

## 2022-03-28 ENCOUNTER — Other Ambulatory Visit (HOSPITAL_COMMUNITY): Payer: Self-pay

## 2022-04-12 ENCOUNTER — Other Ambulatory Visit (HOSPITAL_COMMUNITY): Payer: Self-pay

## 2022-04-12 MED ORDER — PREDNISOLONE SODIUM PHOSPHATE 15 MG/5ML PO SOLN
20.0000 mg | Freq: Every day | ORAL | 0 refills | Status: DC
  Filled 2022-04-12: qty 33.5, 5d supply, fill #0

## 2022-04-16 ENCOUNTER — Other Ambulatory Visit (HOSPITAL_COMMUNITY): Payer: Self-pay

## 2022-04-16 MED ORDER — PREDNISOLONE SODIUM PHOSPHATE 15 MG/5ML PO SOLN
ORAL | 0 refills | Status: AC
  Filled 2022-04-16: qty 120, 8d supply, fill #0

## 2022-04-18 ENCOUNTER — Other Ambulatory Visit (HOSPITAL_COMMUNITY): Payer: Self-pay

## 2022-04-19 ENCOUNTER — Other Ambulatory Visit: Payer: Self-pay

## 2022-04-19 ENCOUNTER — Other Ambulatory Visit (HOSPITAL_COMMUNITY): Payer: Self-pay

## 2022-04-23 ENCOUNTER — Other Ambulatory Visit (HOSPITAL_COMMUNITY): Payer: Self-pay

## 2022-04-23 MED ORDER — AMOXICILLIN 400 MG/5ML PO SUSR
880.0000 mg | Freq: Two times a day (BID) | ORAL | 0 refills | Status: AC
  Filled 2022-04-23: qty 300, 10d supply, fill #0

## 2022-04-30 ENCOUNTER — Other Ambulatory Visit (HOSPITAL_COMMUNITY): Payer: Self-pay

## 2022-05-21 ENCOUNTER — Other Ambulatory Visit (HOSPITAL_COMMUNITY): Payer: Self-pay

## 2022-05-21 MED ORDER — LEVOCETIRIZINE DIHYDROCHLORIDE 5 MG PO TABS
5.0000 mg | ORAL_TABLET | Freq: Every day | ORAL | 5 refills | Status: AC
  Filled 2022-05-21: qty 30, 30d supply, fill #0
  Filled 2022-06-17: qty 30, 30d supply, fill #1
  Filled 2022-07-16: qty 30, 30d supply, fill #2
  Filled 2022-08-15: qty 30, 30d supply, fill #3
  Filled 2022-09-17: qty 30, 30d supply, fill #4
  Filled 2022-10-21: qty 30, 30d supply, fill #5

## 2022-05-22 ENCOUNTER — Other Ambulatory Visit (HOSPITAL_COMMUNITY): Payer: Self-pay

## 2022-05-27 ENCOUNTER — Other Ambulatory Visit (HOSPITAL_COMMUNITY): Payer: Self-pay

## 2022-05-28 ENCOUNTER — Other Ambulatory Visit (HOSPITAL_COMMUNITY): Payer: Self-pay

## 2022-05-29 ENCOUNTER — Other Ambulatory Visit (HOSPITAL_COMMUNITY): Payer: Self-pay

## 2022-06-17 ENCOUNTER — Other Ambulatory Visit (HOSPITAL_COMMUNITY): Payer: Self-pay

## 2022-06-18 ENCOUNTER — Other Ambulatory Visit: Payer: Self-pay

## 2022-06-18 ENCOUNTER — Other Ambulatory Visit (HOSPITAL_COMMUNITY): Payer: Self-pay

## 2022-07-08 ENCOUNTER — Other Ambulatory Visit (HOSPITAL_COMMUNITY): Payer: Self-pay

## 2022-07-08 MED ORDER — CIPRODEX 0.3-0.1 % OT SUSP
4.0000 [drp] | Freq: Two times a day (BID) | OTIC | 0 refills | Status: AC
  Filled 2022-07-08: qty 7.5, 7d supply, fill #0

## 2022-07-16 ENCOUNTER — Other Ambulatory Visit (HOSPITAL_COMMUNITY): Payer: Self-pay

## 2022-07-16 ENCOUNTER — Other Ambulatory Visit: Payer: Self-pay

## 2022-08-15 ENCOUNTER — Other Ambulatory Visit (HOSPITAL_COMMUNITY): Payer: Self-pay

## 2022-08-15 MED ORDER — SODIUM CHLORIDE 3 % IN NEBU
4.0000 mL | INHALATION_SOLUTION | Freq: Four times a day (QID) | RESPIRATORY_TRACT | 11 refills | Status: AC | PRN
  Filled 2022-08-15: qty 750, 13d supply, fill #0
  Filled 2022-11-25: qty 750, 13d supply, fill #1
  Filled 2023-02-04: qty 750, 13d supply, fill #2
  Filled 2023-05-30 (×2): qty 750, 13d supply, fill #3

## 2022-08-16 ENCOUNTER — Other Ambulatory Visit (HOSPITAL_COMMUNITY): Payer: Self-pay

## 2022-08-28 ENCOUNTER — Other Ambulatory Visit (HOSPITAL_COMMUNITY): Payer: Self-pay

## 2022-09-06 ENCOUNTER — Other Ambulatory Visit (HOSPITAL_COMMUNITY): Payer: Self-pay

## 2022-09-06 MED ORDER — EPINEPHRINE 0.3 MG/0.3ML IJ SOAJ
INTRAMUSCULAR | 1 refills | Status: AC
  Filled 2022-09-06: qty 2, 30d supply, fill #0

## 2022-09-09 ENCOUNTER — Other Ambulatory Visit (HOSPITAL_COMMUNITY): Payer: Self-pay

## 2022-09-11 ENCOUNTER — Other Ambulatory Visit (HOSPITAL_COMMUNITY): Payer: Self-pay

## 2022-09-17 ENCOUNTER — Other Ambulatory Visit (HOSPITAL_COMMUNITY): Payer: Self-pay

## 2022-09-18 ENCOUNTER — Other Ambulatory Visit: Payer: Self-pay

## 2022-09-20 ENCOUNTER — Other Ambulatory Visit (HOSPITAL_COMMUNITY): Payer: Self-pay

## 2022-10-21 ENCOUNTER — Other Ambulatory Visit (HOSPITAL_COMMUNITY): Payer: Self-pay

## 2022-10-23 ENCOUNTER — Other Ambulatory Visit (HOSPITAL_COMMUNITY): Payer: Self-pay

## 2022-11-15 ENCOUNTER — Other Ambulatory Visit (HOSPITAL_COMMUNITY): Payer: Self-pay

## 2022-11-15 MED ORDER — ALBUTEROL SULFATE (2.5 MG/3ML) 0.083% IN NEBU
INHALATION_SOLUTION | RESPIRATORY_TRACT | 12 refills | Status: AC
  Filled 2022-11-15: qty 75, 7d supply, fill #0
  Filled 2023-01-09: qty 75, 7d supply, fill #1
  Filled 2023-04-16: qty 75, 7d supply, fill #2
  Filled 2023-05-01: qty 75, 13d supply, fill #3
  Filled 2023-05-05 – 2023-05-12 (×3): qty 75, 13d supply, fill #4
  Filled 2023-06-19: qty 75, 13d supply, fill #5

## 2022-11-15 MED ORDER — ALBUTEROL SULFATE HFA 108 (90 BASE) MCG/ACT IN AERS
2.0000 | INHALATION_SPRAY | RESPIRATORY_TRACT | 6 refills | Status: AC | PRN
  Filled 2022-11-15: qty 18, 17d supply, fill #0

## 2022-11-25 ENCOUNTER — Other Ambulatory Visit (HOSPITAL_COMMUNITY): Payer: Self-pay

## 2022-11-25 MED ORDER — BUDESONIDE-FORMOTEROL FUMARATE 80-4.5 MCG/ACT IN AERO
INHALATION_SPRAY | RESPIRATORY_TRACT | 12 refills | Status: AC
Start: 2022-11-25 — End: ?
  Filled 2022-11-25 (×2): qty 10.2, 15d supply, fill #0
  Filled 2022-12-23: qty 10.2, 15d supply, fill #1
  Filled 2023-01-31: qty 10.2, 25d supply, fill #2
  Filled 2023-02-22: qty 10.2, 25d supply, fill #3
  Filled 2023-03-10 – 2023-03-13 (×2): qty 10.2, 25d supply, fill #4
  Filled 2023-04-16: qty 10.2, 25d supply, fill #5
  Filled 2023-05-12: qty 10.2, 25d supply, fill #6
  Filled 2023-06-19: qty 10.2, 25d supply, fill #7

## 2022-11-25 MED ORDER — MONTELUKAST SODIUM 10 MG PO TABS
10.0000 mg | ORAL_TABLET | Freq: Every day | ORAL | 11 refills | Status: AC
  Filled 2022-11-25 (×2): qty 30, 30d supply, fill #0
  Filled 2022-12-23: qty 30, 30d supply, fill #1

## 2022-11-28 ENCOUNTER — Other Ambulatory Visit (HOSPITAL_COMMUNITY): Payer: Self-pay

## 2022-11-28 MED ORDER — PAXLOVID (300/100) 20 X 150 MG & 10 X 100MG PO TBPK
ORAL_TABLET | ORAL | 0 refills | Status: AC
  Filled 2022-11-28: qty 30, 5d supply, fill #0

## 2022-12-23 ENCOUNTER — Other Ambulatory Visit (HOSPITAL_COMMUNITY): Payer: Self-pay

## 2022-12-24 ENCOUNTER — Other Ambulatory Visit (HOSPITAL_COMMUNITY): Payer: Self-pay

## 2022-12-24 MED ORDER — AMOXICILLIN 875 MG PO TABS
875.0000 mg | ORAL_TABLET | Freq: Two times a day (BID) | ORAL | 0 refills | Status: AC
  Filled 2022-12-24: qty 14, 7d supply, fill #0

## 2023-01-17 ENCOUNTER — Other Ambulatory Visit (HOSPITAL_COMMUNITY): Payer: Self-pay

## 2023-01-17 MED ORDER — ALBUTEROL SULFATE (2.5 MG/3ML) 0.083% IN NEBU
2.5000 mg | INHALATION_SOLUTION | Freq: Two times a day (BID) | RESPIRATORY_TRACT | 1 refills | Status: DC
  Filled 2023-01-17: qty 300, 30d supply, fill #0
  Filled 2023-03-10: qty 270, 15d supply, fill #1
  Filled 2023-04-30: qty 270, 15d supply, fill #2

## 2023-01-17 MED ORDER — MONTELUKAST SODIUM 10 MG PO TABS
10.0000 mg | ORAL_TABLET | Freq: Every day | ORAL | 11 refills | Status: AC
  Filled 2023-01-17: qty 30, 30d supply, fill #0
  Filled 2023-02-22: qty 30, 30d supply, fill #1
  Filled 2023-03-26: qty 30, 30d supply, fill #2
  Filled 2023-04-28: qty 30, 30d supply, fill #3
  Filled 2023-05-30: qty 30, 30d supply, fill #4
  Filled 2023-06-19 – 2023-06-24 (×2): qty 30, 30d supply, fill #5

## 2023-01-20 ENCOUNTER — Other Ambulatory Visit (HOSPITAL_COMMUNITY): Payer: Self-pay

## 2023-01-31 ENCOUNTER — Other Ambulatory Visit (HOSPITAL_COMMUNITY): Payer: Self-pay

## 2023-02-04 ENCOUNTER — Other Ambulatory Visit (HOSPITAL_COMMUNITY): Payer: Self-pay

## 2023-02-06 ENCOUNTER — Other Ambulatory Visit (HOSPITAL_COMMUNITY): Payer: Self-pay

## 2023-02-12 ENCOUNTER — Other Ambulatory Visit (HOSPITAL_COMMUNITY): Payer: Self-pay

## 2023-03-10 ENCOUNTER — Other Ambulatory Visit: Payer: Self-pay

## 2023-03-10 ENCOUNTER — Other Ambulatory Visit (HOSPITAL_COMMUNITY): Payer: Self-pay

## 2023-03-12 ENCOUNTER — Other Ambulatory Visit (HOSPITAL_COMMUNITY): Payer: Self-pay

## 2023-04-01 ENCOUNTER — Other Ambulatory Visit (HOSPITAL_COMMUNITY): Payer: Self-pay

## 2023-04-02 ENCOUNTER — Other Ambulatory Visit (HOSPITAL_COMMUNITY): Payer: Self-pay

## 2023-04-02 MED ORDER — PREDNISOLONE SODIUM PHOSPHATE 15 MG/5ML PO SOLN
ORAL | 0 refills | Status: AC
  Filled 2023-04-02: qty 120, 8d supply, fill #0

## 2023-04-02 MED ORDER — AMOXICILLIN 400 MG/5ML PO SUSR
800.0000 mg | Freq: Two times a day (BID) | ORAL | 0 refills | Status: AC
  Filled 2023-04-02: qty 200, 10d supply, fill #0

## 2023-04-16 ENCOUNTER — Other Ambulatory Visit (HOSPITAL_COMMUNITY): Payer: Self-pay

## 2023-04-28 ENCOUNTER — Other Ambulatory Visit (HOSPITAL_COMMUNITY): Payer: Self-pay

## 2023-05-01 ENCOUNTER — Other Ambulatory Visit (HOSPITAL_COMMUNITY): Payer: Self-pay

## 2023-05-05 ENCOUNTER — Other Ambulatory Visit (HOSPITAL_COMMUNITY): Payer: Self-pay

## 2023-05-05 MED ORDER — ALBUTEROL SULFATE (2.5 MG/3ML) 0.083% IN NEBU
INHALATION_SOLUTION | RESPIRATORY_TRACT | 1 refills | Status: AC
Start: 2023-05-05 — End: ?
  Filled 2023-05-05 – 2023-06-24 (×2): qty 300, 13d supply, fill #0

## 2023-05-12 ENCOUNTER — Other Ambulatory Visit: Payer: Self-pay

## 2023-05-12 ENCOUNTER — Other Ambulatory Visit (HOSPITAL_COMMUNITY): Payer: Self-pay

## 2023-05-23 ENCOUNTER — Other Ambulatory Visit (HOSPITAL_COMMUNITY): Payer: Self-pay

## 2023-05-23 MED ORDER — ALBUTEROL SULFATE (2.5 MG/3ML) 0.083% IN NEBU
3.0000 mL | INHALATION_SOLUTION | Freq: Two times a day (BID) | RESPIRATORY_TRACT | 6 refills | Status: AC
  Filled 2023-05-23: qty 180, 30d supply, fill #0
  Filled 2023-05-23: qty 150, 25d supply, fill #0

## 2023-05-26 ENCOUNTER — Other Ambulatory Visit (HOSPITAL_COMMUNITY): Payer: Self-pay

## 2023-05-30 ENCOUNTER — Other Ambulatory Visit (HOSPITAL_COMMUNITY): Payer: Self-pay

## 2023-06-18 ENCOUNTER — Other Ambulatory Visit (HOSPITAL_COMMUNITY): Payer: Self-pay

## 2023-06-19 ENCOUNTER — Other Ambulatory Visit (HOSPITAL_COMMUNITY): Payer: Self-pay

## 2023-06-19 ENCOUNTER — Other Ambulatory Visit: Payer: Self-pay

## 2023-06-20 ENCOUNTER — Other Ambulatory Visit (HOSPITAL_COMMUNITY): Payer: Self-pay

## 2023-06-23 ENCOUNTER — Other Ambulatory Visit (HOSPITAL_COMMUNITY): Payer: Self-pay

## 2023-06-23 MED ORDER — FLUTICASONE PROPIONATE 50 MCG/ACT NA SUSP
1.0000 | Freq: Every day | NASAL | 11 refills | Status: AC
Start: 2023-06-23 — End: ?
  Filled 2023-06-23: qty 16, 30d supply, fill #0

## 2023-06-24 ENCOUNTER — Other Ambulatory Visit (HOSPITAL_COMMUNITY): Payer: Self-pay

## 2023-06-24 MED ORDER — OSELTAMIVIR PHOSPHATE 6 MG/ML PO SUSR
75.0000 mg | Freq: Two times a day (BID) | ORAL | 0 refills | Status: AC
Start: 2023-06-24 — End: ?
  Filled 2023-06-24: qty 180, 5d supply, fill #0

## 2023-07-02 ENCOUNTER — Other Ambulatory Visit (HOSPITAL_COMMUNITY): Payer: Self-pay

## 2023-07-23 ENCOUNTER — Other Ambulatory Visit (HOSPITAL_COMMUNITY): Payer: Self-pay

## 2023-08-25 ENCOUNTER — Other Ambulatory Visit (HOSPITAL_COMMUNITY): Payer: Self-pay

## 2024-06-01 ENCOUNTER — Ambulatory Visit: Admitting: Dermatology

## 2024-06-01 ENCOUNTER — Encounter: Payer: Self-pay | Admitting: Dermatology

## 2024-06-01 DIAGNOSIS — L650 Telogen effluvium: Secondary | ICD-10-CM | POA: Diagnosis not present

## 2024-06-01 DIAGNOSIS — L700 Acne vulgaris: Secondary | ICD-10-CM

## 2024-06-01 MED ORDER — CLINDAMYCIN PHOSPHATE 1 % EX SWAB: 1.0000 | Freq: Every day | CUTANEOUS | 5 refills | Status: AC

## 2024-06-01 MED ORDER — TRETINOIN 0.025 % EX CREA: TOPICAL_CREAM | CUTANEOUS | 1 refills | Status: AC

## 2024-08-25 ENCOUNTER — Ambulatory Visit: Admitting: Dermatology
